# Patient Record
Sex: Male | Born: 1975 | Race: White | Hispanic: No | Marital: Married | Smoking: Never smoker
Health system: Southern US, Community
[De-identification: ages and names within clinical notes are randomized; demographics above are authoritative.]

## PROBLEM LIST (undated history)

## (undated) DIAGNOSIS — N200 Calculus of kidney: Secondary | ICD-10-CM

## (undated) HISTORY — PX: STONE EXTRACTION WITH BASKET: SHX5318

## (undated) HISTORY — PX: LITHOTRIPSY: SUR834

---

## 2013-04-03 ENCOUNTER — Emergency Department (HOSPITAL_COMMUNITY)
Admission: EM | Admit: 2013-04-03 | Discharge: 2013-04-04 | Disposition: A | Discharge status: Home / Self Care | Payer: Medicaid - Out of State | Attending: Emergency Medicine | Admitting: Emergency Medicine

## 2013-04-03 ENCOUNTER — Encounter (HOSPITAL_COMMUNITY): Payer: Self-pay | Admitting: Emergency Medicine

## 2013-04-03 ENCOUNTER — Emergency Department (HOSPITAL_COMMUNITY): Payer: Medicaid - Out of State

## 2013-04-03 DIAGNOSIS — R109 Unspecified abdominal pain: Secondary | ICD-10-CM | POA: Insufficient documentation

## 2013-04-03 DIAGNOSIS — Z87442 Personal history of urinary calculi: Secondary | ICD-10-CM | POA: Insufficient documentation

## 2013-04-03 DIAGNOSIS — R319 Hematuria, unspecified: Secondary | ICD-10-CM | POA: Insufficient documentation

## 2013-04-03 HISTORY — DX: Calculus of kidney: N20.0

## 2013-04-03 LAB — URINALYSIS, ROUTINE W REFLEX MICROSCOPIC
Bilirubin Urine: NEGATIVE
Glucose, UA: NEGATIVE mg/dL
Ketones, ur: NEGATIVE mg/dL
Leukocytes, UA: NEGATIVE
Nitrite: NEGATIVE
Protein, ur: NEGATIVE mg/dL
Specific Gravity, Urine: 1.028 (ref 1.005–1.030)
Urobilinogen, UA: 1 mg/dL (ref 0.0–1.0)

## 2013-04-03 LAB — POCT I-STAT, CHEM 8
BUN: 9 mg/dL (ref 6–23)
Creatinine, Ser: 1.3 mg/dL (ref 0.50–1.35)
Glucose, Bld: 109 mg/dL — ABNORMAL HIGH (ref 70–99)
HCT: 47 % (ref 39.0–52.0)
Hemoglobin: 16 g/dL (ref 13.0–17.0)
Potassium: 3.2 mEq/L — ABNORMAL LOW (ref 3.5–5.1)
TCO2: 22 mmol/L (ref 0–100)

## 2013-04-03 MED ORDER — ONDANSETRON HCL 4 MG/2ML IJ SOLN
4.0000 mg | Freq: Once | INTRAMUSCULAR | Status: AC
  Administered 2013-04-03: 4 mg via INTRAVENOUS
  Filled 2013-04-03: qty 2

## 2013-04-03 MED ORDER — MORPHINE SULFATE 4 MG/ML IJ SOLN
4.0000 mg | Freq: Once | INTRAMUSCULAR | Status: AC
  Administered 2013-04-03: 4 mg via INTRAVENOUS
  Filled 2013-04-03: qty 1

## 2013-04-03 NOTE — ED Notes (Signed)
Notified EDP, Steinl,MD. Pt. i-stat Chem 8 results 3.2. RN, Sharon Seller made aware.

## 2013-04-03 NOTE — ED Notes (Signed)
Patient is alert and oriented x3.  He is complaining of right flank pain that started about 4 hours ago. He states that he does have a history of kidney stones.  Currently he rates he is pain 7 of 10 without nausea.

## 2013-04-03 NOTE — ED Provider Notes (Signed)
CSN: [id number]     Arrival date & time 04/03/13  2145 History   First MD Initiated Contact with Patient 04/03/13 2210     Chief Complaint  Patient presents with  . Flank Pain    right side   (Consider location/radiation/quality/duration/timing/severity/associated sxs/prior Treatment) HPI Comments: Patient presents today with a chief complaint of right flank pain.  He reports that he feels like the pain is moving to the right side of his abdomen.  Pain has been present for the past 4 hours.  He has a history of kidney stones and reports that the pain today feels similar.  He has not taken anything for pain prior to arrival.  He reports associated hematuria.  He denies dysuria, decreased urination, increased urinary frequency, or urgency.  Denies fever, chills, nausea, or vomiting.  He reports that he is from [place] and is followed by a Insurance underwriter in [place].    Patient is a 37 y.o. male presenting with flank pain. The history is provided by the patient.  Flank Pain Pertinent negatives include no chills or fever.    Past Medical History  Diagnosis Date  . Kidney stones    History reviewed. No pertinent past surgical history. History reviewed. No pertinent family history. History  Substance Use Topics  . Smoking status: Never Smoker   . Smokeless tobacco: Not on file  . Alcohol Use: No    Review of Systems  Constitutional: Negative for fever and chills.  Genitourinary: Positive for hematuria and flank pain.  All other systems reviewed and are negative.    Allergies  Review of patient's allergies indicates no known allergies.  Home Medications   Current Outpatient Rx  Name  Route  Sig  Dispense  Refill  . ibuprofen (ADVIL,MOTRIN) 200 MG tablet   Oral   Take 200 mg by mouth every 6 (six) hours as needed for moderate pain.          BP 151/77  Pulse 82  Temp(Src) 98.6 F (37 C) (Oral)  Resp 18  SpO2 93% Physical Exam  Nursing note and vitals  reviewed. Constitutional: He appears well-developed and well-nourished.  HENT:  Head: Normocephalic and atraumatic.  Mouth/Throat: Oropharynx is clear and moist.  Neck: Normal range of motion. Neck supple.  Cardiovascular: Normal rate, regular rhythm and normal heart sounds.   Pulmonary/Chest: Effort normal and breath sounds normal.  Abdominal: Soft. Bowel sounds are normal. He exhibits no distension and no mass. There is no tenderness. There is CVA tenderness. There is no rebound and no guarding.  Right CVA tenderness  Neurological: He is alert.  Skin: Skin is warm and dry.  Psychiatric: He has a normal mood and affect.    ED Course  Procedures (including critical care time) Labs Review Labs Reviewed  URINALYSIS, ROUTINE W REFLEX MICROSCOPIC - Abnormal; Notable for the following:    Hgb urine dipstick LARGE (*)    All other components within normal limits  URINE MICROSCOPIC-ADD ON   Imaging Review US Renal  04/04/2013   CLINICAL DATA:  Right flank pain  EXAM: RENAL/URINARY TRACT ULTRASOUND COMPLETE  COMPARISON:  None.  FINDINGS: Right Kidney  Length: 11.9 cm. Echogenicity within normal limits. No mass or hydronephrosis visualized.  Left Kidney  Length: 11.8 cm. Echogenicity within normal limits. No mass or hydronephrosis visualized.  Bladder  Appears normal for degree of bladder distention.  IMPRESSION: Normal renal ultrasound.   Electronically Signed   By: Sherian Rein M.D.   On: 04/04/2013 00:48  EKG Interpretation   None      1:09 AM Reassessed patient.  He reports that his pain is improved at this time.  MDM  No diagnosis found. Patient with history of kidney stones presents today with a chief complaint of right flank pain that he reports is consistent with previous kidney stones.  UA does not show any evidence of infection.  Patient with normal renal ultrasound.  Patient did not want CT ab/pelvis w/o contrast due to radiation exposure.  Pain controlled while in the  ED.  Patient stable for discharge.  Patient instructed to follow up with Urologist in [place].  Return precautions given.    Santiago Glad, PA-C 04/04/13 [phone]

## 2013-04-04 MED ORDER — HYDROCODONE-ACETAMINOPHEN 5-325 MG PO TABS: 2.0000 | ORAL_TABLET | Freq: Four times a day (QID) | ORAL | Status: DC | PRN

## 2013-04-04 MED ORDER — TAMSULOSIN HCL 0.4 MG PO CAPS: 0.4000 mg | ORAL_CAPSULE | Freq: Every day | ORAL | Status: DC

## 2013-04-04 MED ORDER — ONDANSETRON 4 MG PO TBDP: 4.0000 mg | ORAL_TABLET | Freq: Three times a day (TID) | ORAL | Status: DC | PRN

## 2013-04-04 MED ORDER — MORPHINE SULFATE 4 MG/ML IJ SOLN
4.0000 mg | Freq: Once | INTRAMUSCULAR | Status: AC
Start: 2013-04-04 — End: 2013-04-04
  Administered 2013-04-04: 4 mg via INTRAVENOUS
  Filled 2013-04-04: qty 1

## 2013-04-04 NOTE — ED Provider Notes (Signed)
Medical screening examination/treatment/procedure(s) were conducted as a shared visit with non-physician practitioner(s) and myself.  I personally evaluated the patient during the encounter.  EKG Interpretation   None       Pt c/o right flank pain. abd soft nt. No cva tenderness. Spine nt. Labs, imaging.   Suzi Roots, MD 04/04/13 [phone]

## 2013-04-04 NOTE — ED Notes (Signed)
US at bedside

## 2013-04-17 ENCOUNTER — Ambulatory Visit: Payer: 59

## 2013-04-17 ENCOUNTER — Ambulatory Visit: Payer: 59 | Admitting: Family Medicine

## 2013-04-17 VITALS — BP 118/68 | HR 133 | Temp 98.8°F | Resp 18 | Ht 68.0 in | Wt 185.0 lb

## 2013-04-17 DIAGNOSIS — M25511 Pain in right shoulder: Secondary | ICD-10-CM

## 2013-04-17 DIAGNOSIS — M25519 Pain in unspecified shoulder: Secondary | ICD-10-CM

## 2013-04-17 DIAGNOSIS — S43429A Sprain of unspecified rotator cuff capsule, initial encounter: Secondary | ICD-10-CM

## 2013-04-17 DIAGNOSIS — S43421A Sprain of right rotator cuff capsule, initial encounter: Secondary | ICD-10-CM

## 2013-04-17 MED ORDER — HYDROCODONE-ACETAMINOPHEN 7.5-325 MG PO TABS: 1.0000 | ORAL_TABLET | Freq: Four times a day (QID) | ORAL | Status: DC | PRN

## 2013-04-17 NOTE — Patient Instructions (Signed)
Continue to try and exercise shoulder gently.  Take ibuprofen 800 mg 3 times daily as needed for pain and inflammation  For severe pain take the Norco one every 6 hours as needed  Referral is being made to an orthopedist. If you do not hear from her referrals desk by Wednesday please call back

## 2013-04-17 NOTE — Progress Notes (Signed)
   Subjective:    Patient ID: Michael Smith, male    DOB: [birth year], 37 y.o.   MRN: [id number]  HPI    Review of Systems     Objective:   Physical Exam        Assessment & Plan:

## 2013-04-17 NOTE — Progress Notes (Signed)
Subjective: Patient has a history of a rotator cuff tear. He moved here from elsewhere. He had a previous MRI earlier this year that showed a rotator cuff tear. He had been treated with exercises and rest and medication. He doesn't well. He used narcotic pain patches for a couple of months on and off. He said he had trouble when they stopped his Norco 10, but didn't have any trouble with the pain patches. He move some furniture the other day and hurt himself again. It was hurting intensely and anterior aspect of the right shoulder. He would like further evaluation and help.  Objective: Very limited range of motion right shoulder. Tender anterior aspect of the shoulder  [facility] reading (PRIMARY) by  Dr. Alwyn Ren Normal x-ray shoulder  Assessment: Painful right shoulder Rotator cuff tear right shoulder  Plan: Refer to orthopedics Dr Rennis Chris if possible .

## 2013-04-19 ENCOUNTER — Ambulatory Visit: Payer: 59 | Admitting: Family Medicine

## 2013-04-19 VITALS — BP 120/102 | HR 115 | Temp 98.7°F | Resp 18 | Wt 189.0 lb

## 2013-04-19 DIAGNOSIS — M25511 Pain in right shoulder: Secondary | ICD-10-CM

## 2013-04-19 DIAGNOSIS — M25519 Pain in unspecified shoulder: Secondary | ICD-10-CM

## 2013-04-19 MED ORDER — OXYCODONE-ACETAMINOPHEN 5-325 MG PO TABS: 1.0000 | ORAL_TABLET | Freq: Four times a day (QID) | ORAL | Status: DC | PRN

## 2013-04-19 NOTE — Progress Notes (Addendum)
Subjective:    Patient ID: Michael Smith, male    DOB: Feb 13, 1976, 37 y.o.   MRN: [id number]  This chart was scribed for Michael Staggers, MD by Michael Smith, Medical Scribe. This patient's care was started at 12:26 PM.  HPI HPI Comments: Michael Smith is a 37 y.o. male who presents to the office for follow up. Pt saw Dr. Alwyn Smith 2 days ago for right shoulder pain that intermittently radiates into his right arm that started 4 days prior to his last visit. Movement worsens the pain. Pt was given Vicodin and an orthopedic referral by Dr. Alwyn Smith. Right shoulder xray 2 days ago showed no evidence of fracture or dislocation. Normal exam. He has also taken 800 mg ibuprofen every 4 hours with no relief. Pt has not been given any other pain medications since moving here one month ago. He was diagnosed with a rotator cuff tear 4 months ago while living in [place] and was awaiting surgery for treatment. There was a hold up on surgery due to insurance problems. He was given fentanyl patches with some relief of pain. Denies history of addiction to pain medications.   There are no active problems to display for this patient.  Past Medical History  Diagnosis Date  . Kidney stones    Past Surgical History  Procedure Laterality Date  . Lithotripsy     No Known Allergies Prior to Admission medications   Medication Sig Start Date End Date Taking? Authorizing Provider  HYDROcodone-acetaminophen (NORCO) 7.5-325 MG per tablet Take 1 tablet by mouth every 6 (six) hours as needed. 04/17/13  Yes Michael Najjar, MD  ibuprofen (ADVIL,MOTRIN) 200 MG tablet Take 200 mg by mouth every 6 (six) hours as needed for moderate pain.   Yes Historical Provider, MD  tamsulosin (FLOMAX) 0.4 MG CAPS capsule Take 1 capsule (0.4 mg total) by mouth daily. 04/04/13  Yes Michael Laisure, PA-C  HYDROcodone-acetaminophen (NORCO/VICODIN) 5-325 MG per tablet Take 2 tablets by mouth every 6 (six) hours as needed. 04/04/13   Michael  Laisure, PA-C  ondansetron (ZOFRAN ODT) 4 MG disintegrating tablet Take 1 tablet (4 mg total) by mouth every 8 (eight) hours as needed for nausea or vomiting. 04/04/13   Michael Glad, PA-C   History   Social History  . Marital Status: Married    Spouse Name: N/A    Number of Children: N/A  . Years of Education: N/A   Occupational History  . Not on file.   Social History Main Topics  . Smoking status: Never Smoker   . Smokeless tobacco: Not on file  . Alcohol Use: No  . Drug Use: Not on file  . Sexual Activity: Not on file   Other Topics Concern  . Not on file   Social History Narrative  . No narrative on file    Review of Systems  Musculoskeletal: Positive for arthralgias and myalgias.       Objective:   Physical Exam  Vitals reviewed. Constitutional: He is oriented to person, place, and time. He appears well-developed and well-nourished. No distress.  HENT:  Head: Normocephalic and atraumatic.  Neck: Neck supple.  Slight upper right shoulder pain with left lateral flexion of the neck. Negative Spurling's. Full ROM.   Cardiovascular: Normal rate.   Capillary refill less than 2 seconds. 2+ radial pulse.   Pulmonary/Chest: Effort normal.  Musculoskeletal:  Right shoulder no focal tenderness. Bicipital groove non tender. AC non tender. Guarded with active ROM. Flexion is only approximately 30  degrees. Same with abduction. Internal rotation to greater troch of hip. Still guarded with passive ROM. Flexion and abduction only to 30-45 degrees due to pain.   Neurological: He is alert and oriented to person, place, and time.  Neurovascularly intact distally.   Skin: Skin is warm and dry.  Psychiatric: He has a normal mood and affect. His behavior is normal.    Filed Vitals:   04/19/13 1209  BP: 120/102  Pulse: 115  Temp: 98.7 F (37.1 C)  TempSrc: Oral  Resp: 18  Weight: 189 lb (85.73 kg)  SpO2: 100%   Controlled substance database reviewed. Correlates with  history provided with one prior prescription before rx here 2 days ago.   Assessment & Plan:   Michael Smith is a 37 y.o. male Pain in joint, shoulder region, right - Plan: oxyCODONE-acetaminophen (ROXICET) 5-325 MG per tablet  R Rotator cuff tear by history , but MRI unavailable for review. Guarded exam - difficult to assess today, with increased pain, but DDX of adhesive capsulitis with degree of guarding and difficult PROM on eval. CSRS reviewed and matches history provided. Will Rx short course of percocet as below until seen by ortho, and sling - has at home. He will obtain copy of MRI from prior provider.   Meds ordered this encounter  Medications  . oxyCODONE-acetaminophen (ROXICET) 5-325 MG per tablet    Sig: Take 1-2 tablets by mouth every 6 (six) hours as needed for severe pain.    Dispense:  20 tablet    Refill:  0    Patient Instructions  I will provide a temporary prescription for percocet until you can be seen by orthopaedic doctor.  Based on your exam you may have a possible "frozen shoulder" or adhesive capsulitis, but this can be discussed with orthopaedic doctor. Try to get a copy of your MRI and report as this may be needed at your visit with Ortho.  Return to the clinic or go to the nearest emergency room if any of your symptoms worsen or new symptoms occur.     I personally performed the services described in this documentation, which was scribed in my presence. The recorded information has been reviewed and considered, and addended by me as needed.

## 2013-04-19 NOTE — Patient Instructions (Addendum)
I will provide a temporary prescription for percocet until you can be seen by orthopaedic doctor.  Based on your exam you may have a possible "frozen shoulder" or adhesive capsulitis, but this can be discussed with orthopaedic doctor. Try to get a copy of your MRI and report as this may be needed at your visit with Ortho.  Return to the clinic or go to the nearest emergency room if any of your symptoms worsen or new symptoms occur.

## 2013-04-21 ENCOUNTER — Ambulatory Visit (INDEPENDENT_AMBULATORY_CARE_PROVIDER_SITE_OTHER): Payer: 59 | Admitting: Family Medicine

## 2013-04-21 VITALS — BP 122/102 | HR 109 | Temp 98.7°F | Resp 18 | Wt 188.0 lb

## 2013-04-21 DIAGNOSIS — G894 Chronic pain syndrome: Secondary | ICD-10-CM

## 2013-04-21 DIAGNOSIS — M25511 Pain in right shoulder: Secondary | ICD-10-CM

## 2013-04-21 DIAGNOSIS — M25519 Pain in unspecified shoulder: Secondary | ICD-10-CM

## 2013-04-21 MED ORDER — OXYCODONE-ACETAMINOPHEN 5-325 MG PO TABS: 1.0000 | ORAL_TABLET | Freq: Four times a day (QID) | ORAL | Status: DC | PRN

## 2013-04-21 NOTE — Patient Instructions (Signed)
I will write for 1 week of percocet to be taken up to 1-2 every 6 hours as needed. Will not be able to fill early. Go to the nearest emergency room if any of your symptoms worsen or new symptoms occur. We will continue to work on orthopaedic referral, and refer to pain management as will not be able to provide chronic pain medications at our office. If still not seen by ortho in 1 week - return to discuss plan and medications.

## 2013-04-21 NOTE — Progress Notes (Addendum)
Subjective:    Patient ID: Michael Smith, male    DOB: 01/10/1976, 37 y.o.   MRN: [id number] This chart was scribed for Meredith Staggers, MD by Danella Maiers, ED Scribe. This patient was seen in room 3 and the patient's care was started at 4:41 PM.  Chief Complaint  Patient presents with  . Follow-up    riight shoulder    HPI HPI Comments: Michael Smith is a 37 y.o. male who presents to the [facility] for a f/u from 2 days ago with right shoulder pain. Had MRI with rotator cuff tear 4 months ago and had been referred to orthopaedics for eval. Increased pain last office visit uncontrolled with Vicodin prescription for percocet 1-2 every 6 hours.   Pt reports he is taking 2 percocet every 6 hours during the day. He states he has been taking 2 every four hours the last 2 nights to be able to sleep. He states it is helping the pain. He was previously seeing PCP Dr Michael Smith at [facility] in [place]. His wife is sending a copy of the MRI. Pt was seen by a different orthopedist 4 months ago and had 2nd MRI. He states he has not had surgery yet because of insurance issues. He states he had withdrawal symptoms a couple years ago with some concern for addiction but denies feeling addicted now.  Call placed to Dr Michael Smith at [facility] phone number [phone]. Per discussion pt had MRI approximately 1 1/2 years ago with partial thickness tear. Was referred to ortho but was not able to keep that appt. Per Dr Michael Smith, did have some concerns with early refill requests for pain medications and had taken fentanyl patch. Checked into referral locally, unable to be seen by initial office due to self pay status, but will be referred to [facility]. Fax from Dr Michael Smith regarding MRI noted. MRI of right shoulder Sep 24 2011 had mild partial thickness tearing or tendinopathy within the distal infraspinatus tendon along with early degenerative joint disease.   Not wearing sling in office  as was too hot, but has been wearing this.    There are no active problems to display for this patient.  Past Medical History  Diagnosis Date  . Kidney stones    Past Surgical History  Procedure Laterality Date  . Lithotripsy     No Known Allergies Prior to Admission medications   Medication Sig Start Date End Date Taking? Authorizing Provider  ibuprofen (ADVIL,MOTRIN) 200 MG tablet Take 200 mg by mouth every 6 (six) hours as needed for moderate pain.   Yes Historical Provider, MD  oxyCODONE-acetaminophen (ROXICET) 5-325 MG per tablet Take 1-2 tablets by mouth every 6 (six) hours as needed for severe pain. 04/19/13  Yes Shade Flood, MD  tamsulosin (FLOMAX) 0.4 MG CAPS capsule Take 1 capsule (0.4 mg total) by mouth daily. 04/04/13  Yes Heather Laisure, PA-C  HYDROcodone-acetaminophen (NORCO) 7.5-325 MG per tablet Take 1 tablet by mouth every 6 (six) hours as needed. 04/17/13   Peyton Najjar, MD  HYDROcodone-acetaminophen (NORCO/VICODIN) 5-325 MG per tablet Take 2 tablets by mouth every 6 (six) hours as needed. 04/04/13   Santiago Glad, PA-C   History  Substance Use Topics  . Smoking status: Never Smoker   . Smokeless tobacco: Not on file  . Alcohol Use: No    Review of Systems  Musculoskeletal: Positive for arthralgias (right shoulder).  Objective:   Physical Exam  Nursing note and vitals reviewed. Constitutional: He is oriented to person, place, and time. He appears well-developed and well-nourished. No distress.  HENT:  Head: Normocephalic and atraumatic.  Eyes: EOM are normal.  Neck: Neck supple. No tracheal deviation present.  Cardiovascular: Normal rate and regular rhythm.  Exam reveals no gallop and no friction rub.   No murmur heard. Pulmonary/Chest: Effort normal. No respiratory distress.  Musculoskeletal: Normal range of motion.  Minimal soreness anterior shoulder at biceps groove. No SV or AC tenderness. Only able to move approximately 30degress  flexion and abduction. Internal rotation to greater trochanter of right hip.  Neurological: He is alert and oriented to person, place, and time.  Neurovascularly intact distally to fingertips.  Skin: Skin is warm and dry.  Psychiatric: He has a normal mood and affect. His behavior is normal.     Filed Vitals:   04/21/13 1636  BP: 122/102  Pulse: 109  Temp: 98.7 F (37.1 C)  TempSrc: Oral  Resp: 18  Weight: 188 lb (85.276 kg)  SpO2: 99%        Assessment & Plan:   Michael Smith is a 37 y.o. male Pain in joint, shoulder region, right - Plan: oxyCODONE-acetaminophen (ROXICET) 5-325 MG per tablet, Ambulatory referral to Pain Clinic  Chronic pain syndrome - Plan: Ambulatory referral to Pain Clinic  R shoulder pain, chronic by hx, and partial thickness RTC tear on MRI form 2013, discussed with prior provider in [place]. More recent MRI from few months ago is pending. reinjury recently with moving furniture, but XR without fx. Ortho eval pending, and discussed repeat MRI, but plan to follow up with orhto planned first to determine this. Discussion with prior provider -noted some concern of early refills of medications and discussed this with pt. Addiction to these medications was denied but I discussed if he felt this was a concern I would be happy to provide resources. Will continue percocet 1-2 every 6 hours with one week of medication provided while waiting on ortho eval. Will also refer to pain management in case longer course of medication will be needed but can follow up with me in one week if has not been seen by ortho. To wear sling and avoid use of right arm for now. ER precautions discussed.   Meds ordered this encounter  Medications  . oxyCODONE-acetaminophen (ROXICET) 5-325 MG per tablet    Sig: Take 1-2 tablets by mouth every 6 (six) hours as needed for severe pain.    Dispense:  56 tablet    Refill:  0   Patient Instructions  I will write for 1 week of percocet to be  taken up to 1-2 every 6 hours as needed. Will not be able to fill early. Go to the nearest emergency room if any of your symptoms worsen or new symptoms occur. We will continue to work on orthopaedic referral, and refer to pain management as will not be able to provide chronic pain medications at our office. If still not seen by ortho in 1 week - return to discuss plan and medications.

## 2013-04-24 ENCOUNTER — Emergency Department (HOSPITAL_COMMUNITY): Payer: 59

## 2013-04-24 ENCOUNTER — Emergency Department (HOSPITAL_COMMUNITY)
Admission: EM | Admit: 2013-04-24 | Discharge: 2013-04-24 | Disposition: A | Discharge status: Home / Self Care | Payer: 59 | Attending: Emergency Medicine | Admitting: Emergency Medicine

## 2013-04-24 ENCOUNTER — Encounter (HOSPITAL_COMMUNITY): Payer: Self-pay | Admitting: Emergency Medicine

## 2013-04-24 DIAGNOSIS — Z79899 Other long term (current) drug therapy: Secondary | ICD-10-CM | POA: Insufficient documentation

## 2013-04-24 DIAGNOSIS — R109 Unspecified abdominal pain: Secondary | ICD-10-CM

## 2013-04-24 DIAGNOSIS — N200 Calculus of kidney: Secondary | ICD-10-CM | POA: Insufficient documentation

## 2013-04-24 LAB — URINE MICROSCOPIC-ADD ON

## 2013-04-24 LAB — URINALYSIS, ROUTINE W REFLEX MICROSCOPIC
Bilirubin Urine: NEGATIVE
Glucose, UA: NEGATIVE mg/dL
Ketones, ur: NEGATIVE mg/dL
Protein, ur: NEGATIVE mg/dL
Specific Gravity, Urine: 1.023 (ref 1.005–1.030)
Urobilinogen, UA: 0.2 mg/dL (ref 0.0–1.0)

## 2013-04-24 MED ORDER — ONDANSETRON 4 MG PO TBDP: 4.0000 mg | ORAL_TABLET | Freq: Three times a day (TID) | ORAL | Status: DC | PRN

## 2013-04-24 MED ORDER — KETOROLAC TROMETHAMINE 30 MG/ML IJ SOLN
30.0000 mg | Freq: Once | INTRAMUSCULAR | Status: DC
  Filled 2013-04-24: qty 1

## 2013-04-24 MED ORDER — OXYCODONE-ACETAMINOPHEN 10-650 MG PO TABS: 1.0000 | ORAL_TABLET | Freq: Four times a day (QID) | ORAL | Status: DC | PRN

## 2013-04-24 MED ORDER — MORPHINE SULFATE 4 MG/ML IJ SOLN
4.0000 mg | Freq: Once | INTRAMUSCULAR | Status: AC
  Administered 2013-04-24: 4 mg via INTRAVENOUS
  Filled 2013-04-24: qty 1

## 2013-04-24 MED ORDER — ONDANSETRON HCL 4 MG/2ML IJ SOLN
4.0000 mg | Freq: Once | INTRAMUSCULAR | Status: AC
  Administered 2013-04-24: 4 mg via INTRAVENOUS
  Filled 2013-04-24: qty 2

## 2013-04-24 MED ORDER — SODIUM CHLORIDE 0.9 % IV SOLN
Freq: Once | INTRAVENOUS | Status: AC
  Administered 2013-04-24: 03:00:00 via INTRAVENOUS

## 2013-04-24 MED ORDER — KETOROLAC TROMETHAMINE 30 MG/ML IJ SOLN
30.0000 mg | Freq: Once | INTRAMUSCULAR | Status: AC
  Administered 2013-04-24: 30 mg via INTRAVENOUS

## 2013-04-24 NOTE — ED Notes (Signed)
Pt presents with c/o right flank pain that started about 2 hrs ago  Pt has hx of kidney stones  Pt denies nausea or vomiting at this time  Pt states he does have blood in his urine

## 2013-04-24 NOTE — ED Provider Notes (Signed)
CSN: [id number]     Arrival date & time 04/24/13  0226 History   First MD Initiated Contact with Patient 04/24/13 [phone]     Chief Complaint  Patient presents with  . Flank Pain   (Consider location/radiation/quality/duration/timing/severity/associated sxs/prior Treatment) HPI Comments: Patient states he has a history of kidney stands.  He, states is usually able to pass the stones, but did have to have lithotripsy, one time for 8mm stone, usually seen in his home of  [place].  He is currently in [place], working a job, and plans to relocate here.  He was seen November 14 for right flank pain with a negative ultrasound, but states he passed a small stone the next day.  He did fine until 2 hours prior to arrival when he again developed right flank pain, and hematuria.  He too 2 Percocet  for his discomfort without relief. He, states he has nausea, but no vomiting.  No diarrhea  Patient is a 37 y.o. male presenting with flank pain. The history is provided by the patient.  Flank Pain This is a recurrent problem. The current episode started today. The problem occurs constantly. The problem has been unchanged. Associated symptoms include nausea. Pertinent negatives include no fever, urinary symptoms or vomiting. Nothing aggravates the symptoms. He has tried nothing for the symptoms. The treatment provided no relief.    Past Medical History  Diagnosis Date  . Kidney stones    Past Surgical History  Procedure Laterality Date  . Lithotripsy     History reviewed. No pertinent family history. History  Substance Use Topics  . Smoking status: Never Smoker   . Smokeless tobacco: Not on file  . Alcohol Use: No    Review of Systems  Constitutional: Negative for fever.  Gastrointestinal: Positive for nausea. Negative for vomiting.  Genitourinary: Positive for flank pain. Negative for dysuria, frequency and scrotal swelling.  All other systems reviewed and are negative.    Allergies   Review of patient's allergies indicates no known allergies.  Home Medications   Current Outpatient Rx  Name  Route  Sig  Dispense  Refill  . oxyCODONE-acetaminophen (ROXICET) 5-325 MG per tablet   Oral   Take 1-2 tablets by mouth every 6 (six) hours as needed for severe pain.   56 tablet   0   . tamsulosin (FLOMAX) 0.4 MG CAPS capsule   Oral   Take 1 capsule (0.4 mg total) by mouth daily.   30 capsule   0   . ibuprofen (ADVIL,MOTRIN) 200 MG tablet   Oral   Take 200 mg by mouth every 6 (six) hours as needed for moderate pain.         Marland Kitchen ondansetron (ZOFRAN ODT) 4 MG disintegrating tablet   Oral   Take 1 tablet (4 mg total) by mouth every 8 (eight) hours as needed for nausea or vomiting.   20 tablet   0   . oxyCODONE-acetaminophen (PERCOCET) 10-650 MG per tablet   Oral   Take 1 tablet by mouth every 6 (six) hours as needed for pain.   20 tablet   0    BP 181/147  Pulse 96  Temp(Src) 98.5 F (36.9 C) (Oral)  Resp 20  SpO2 99% Physical Exam  Nursing note and vitals reviewed. Constitutional: He is oriented to person, place, and time. He appears well-developed and well-nourished. He appears distressed.  HENT:  Head: Normocephalic.  Eyes: Pupils are equal, round, and reactive to light.  Neck: Normal range  of motion.  Cardiovascular: Regular rhythm.   Pulmonary/Chest: Effort normal.  Musculoskeletal: Normal range of motion.  Neurological: He is alert and oriented to person, place, and time.  Skin: Skin is warm. There is pallor.    ED Course  Procedures (including critical care time) Labs Review Labs Reviewed  URINALYSIS, ROUTINE W REFLEX MICROSCOPIC - Abnormal; Notable for the following:    APPearance CLOUDY (*)    Hgb urine dipstick LARGE (*)    All other components within normal limits  URINE MICROSCOPIC-ADD ON   Imaging Review Ct Abdomen Pelvis Wo Contrast  04/24/2013   CLINICAL DATA:  Right flank pain, gross hematuria.  EXAM: CT ABDOMEN AND PELVIS  WITHOUT CONTRAST  TECHNIQUE: Multidetector CT imaging of the abdomen and pelvis was performed following the standard protocol without intravenous contrast.  COMPARISON:  None.  FINDINGS: 3 mm nodule is noted within the left lower lobe (series 3, image 4). Lungs are otherwise unremarkable.  Limited noncontrast evaluation liver is unremarkable. Gallbladder is within normal limits. There is no biliary ductal dilatation.  A few scattered subcentimeter splenic hypodensities are noted, the largest of which measures 5 mm (series 2, image 19). These are too small the characterize by CT. Spleen is otherwise unremarkable. The adrenal glands and pancreas are within normal limits.  Multiple nonobstructive calculi are seen within the left kidney, the largest of which is located in the lower pole and measures 4 mm. No obstructive left renal stones identified. There is no left-sided hydronephrosis or hydroureter.  On the right, a 4 mm obstructive stone is present within the mid right ureter (series 4, image 41). There is secondary moderate hydroureteronephrosis proximally. Additional tiny nonobstructive renal calculi are seen within the right kidney, the largest of which is located in the lower pole calyx and measures 4 mm. Sign  There is no evidence of bowel obstruction. Appendix is well visualized in the right lower quadrant and is unremarkable. No inflammatory changes seen about the bowels.  The bladder is largely decompressed and not well evaluated. Prostate is normal.  No free air or fluid. No pathologically enlarged intra-abdominal pelvic lymph nodes.  Osseous structures are within normal limits.  IMPRESSION: 1. 4 mm obstructive stone within the mid right ureter with secondary moderate hydroureteronephrosis proximally. 2. Additional bilateral nonobstructive renal calculi as above. 3. 3 mm left lower lobe pulmonary nodule. If the patient has no underlying risk factors (i.e. smoking), no further followup is needed. If there  are underlying risk factors, a followup CT in 12 months could be considered to evaluate for interval change as clinically desired.   Electronically Signed   By: Rise Mu M.D.   On: 04/24/2013 03:31    EKG Interpretation   None       MDM   1. Right flank pain   2. Kidney stone     Mr. per states, that his pain is waxing and waning between a 4 and 5.  He thinks he can manage at home with pain management antiemetics.  He will be given a referral to local urology he knows he can return at any time if needed.  For increased intolerable, unmanageable pain    Arman Filter, NP 04/24/13 [phone]

## 2013-04-24 NOTE — ED Notes (Signed)
Pt aware of need for urine sample.  

## 2013-04-24 NOTE — ED Notes (Signed)
Patient transported to CT 

## 2013-04-24 NOTE — ED Provider Notes (Signed)
Medical screening examination/treatment/procedure(s) were performed by non-physician practitioner and as supervising physician I was immediately available for consultation/collaboration.    Avalee Castrellon, MD 04/24/13 0609 

## 2013-04-26 ENCOUNTER — Emergency Department (HOSPITAL_COMMUNITY)
Admission: EM | Admit: 2013-04-26 | Discharge: 2013-04-26 | Disposition: A | Discharge status: Home / Self Care | Payer: 59 | Attending: Emergency Medicine | Admitting: Emergency Medicine

## 2013-04-26 ENCOUNTER — Emergency Department (HOSPITAL_COMMUNITY): Payer: 59

## 2013-04-26 ENCOUNTER — Encounter (HOSPITAL_COMMUNITY): Payer: Self-pay | Admitting: Emergency Medicine

## 2013-04-26 DIAGNOSIS — Z87442 Personal history of urinary calculi: Secondary | ICD-10-CM | POA: Insufficient documentation

## 2013-04-26 DIAGNOSIS — Z79899 Other long term (current) drug therapy: Secondary | ICD-10-CM | POA: Insufficient documentation

## 2013-04-26 DIAGNOSIS — N201 Calculus of ureter: Secondary | ICD-10-CM

## 2013-04-26 LAB — BASIC METABOLIC PANEL
CO2: 25 mEq/L (ref 19–32)
Calcium: 9.6 mg/dL (ref 8.4–10.5)
Creatinine, Ser: 0.99 mg/dL (ref 0.50–1.35)
GFR calc Af Amer: >90 mL/min (ref >90)
Potassium: 3.5 mEq/L (ref 3.5–5.1)
Sodium: 135 mEq/L (ref 135–145)

## 2013-04-26 LAB — URINE MICROSCOPIC-ADD ON

## 2013-04-26 LAB — URINALYSIS, ROUTINE W REFLEX MICROSCOPIC
Bilirubin Urine: NEGATIVE
Glucose, UA: NEGATIVE mg/dL
Ketones, ur: NEGATIVE mg/dL
Leukocytes, UA: NEGATIVE
Nitrite: NEGATIVE
Protein, ur: NEGATIVE mg/dL
Urobilinogen, UA: 0.2 mg/dL (ref 0.0–1.0)
pH: 7.5 (ref 5.0–8.0)

## 2013-04-26 MED ORDER — OXYCODONE-ACETAMINOPHEN 5-325 MG PO TABS
2.0000 | ORAL_TABLET | Freq: Once | ORAL | Status: AC
  Administered 2013-04-26: 2 via ORAL
  Filled 2013-04-26: qty 2

## 2013-04-26 MED ORDER — SODIUM CHLORIDE 0.9 % IV BOLUS (SEPSIS)
1000.0000 mL | Freq: Once | INTRAVENOUS | Status: AC
  Administered 2013-04-26: 1000 mL via INTRAVENOUS

## 2013-04-26 MED ORDER — HYDROMORPHONE HCL PF 1 MG/ML IJ SOLN
1.0000 mg | Freq: Once | INTRAMUSCULAR | Status: AC
  Administered 2013-04-26: 1 mg via INTRAVENOUS
  Filled 2013-04-26: qty 1

## 2013-04-26 MED ORDER — HYDROMORPHONE HCL 4 MG PO TABS: 4.0000 mg | ORAL_TABLET | ORAL | Status: DC | PRN

## 2013-04-26 MED ORDER — KETOROLAC TROMETHAMINE 30 MG/ML IJ SOLN
30.0000 mg | Freq: Once | INTRAMUSCULAR | Status: AC
  Administered 2013-04-26: 30 mg via INTRAVENOUS
  Filled 2013-04-26: qty 1

## 2013-04-26 NOTE — ED Notes (Signed)
Pt aware of the need for urine, cannot provide at this time. Pt given urinal

## 2013-04-26 NOTE — ED Provider Notes (Signed)
CSN: [id number]     Arrival date & time 04/26/13  1629 History   First MD Initiated Contact with Patient 04/26/13 1647     Chief Complaint  Patient presents with  . Nephrolithiasis   (Consider location/radiation/quality/duration/timing/severity/associated sxs/prior Treatment) Patient is a 37 y.o. male presenting with abdominal pain.  Abdominal Pain Pain location:  RLQ and R flank Pain quality: sharp   Pain radiation: r groin. Pain severity:  Severe Onset quality:  Gradual Duration: improved since Friday, until this morning, when it suddenly became worse again. Timing:  Constant Progression:  Worsening Chronicity:  Recurrent Context comment:  Hx of kidney stones, seen in ED 2 days ago, found to have 4mm obstructing stone Relieved by:  Nothing Worsened by:  Nothing tried Ineffective treatments: percocet. Associated symptoms: hematuria   Associated symptoms: no fever, no nausea and no vomiting     Past Medical History  Diagnosis Date  . Kidney stones    Past Surgical History  Procedure Laterality Date  . Lithotripsy     No family history on file. History  Substance Use Topics  . Smoking status: Never Smoker   . Smokeless tobacco: Not on file  . Alcohol Use: No    Review of Systems  Constitutional: Negative for fever.  Gastrointestinal: Positive for abdominal pain. Negative for nausea and vomiting.  Genitourinary: Positive for hematuria.  All other systems reviewed and are negative.    Allergies  Review of patient's allergies indicates no known allergies.  Home Medications   Current Outpatient Rx  Name  Route  Sig  Dispense  Refill  . ibuprofen (ADVIL,MOTRIN) 200 MG tablet   Oral   Take 200 mg by mouth every 8 (eight) hours as needed for moderate pain.          Marland Kitchen oxyCODONE-acetaminophen (PERCOCET) 10-650 MG per tablet   Oral   Take 1 tablet by mouth every 6 (six) hours as needed for pain.   20 tablet   0   . tamsulosin (FLOMAX) 0.4 MG CAPS capsule  Oral   Take 1 capsule (0.4 mg total) by mouth daily.   30 capsule   0    BP 159/133  Pulse 98  Temp(Src) 98 F (36.7 C) (Oral)  Resp 20  SpO2 100% Physical Exam  Nursing note and vitals reviewed. Constitutional: He is oriented to person, place, and time. He appears well-developed and well-nourished. No distress.  HENT:  Head: Normocephalic and atraumatic.  Mouth/Throat: Oropharynx is clear and moist.  Eyes: Conjunctivae are normal. Pupils are equal, round, and reactive to light. No scleral icterus.  Neck: Neck supple.  Cardiovascular: Normal rate, regular rhythm, normal heart sounds and intact distal pulses.   No murmur heard. Pulmonary/Chest: Effort normal and breath sounds normal. No stridor. No respiratory distress. He has no wheezes. He has no rales.  Abdominal: Soft. He exhibits no distension. There is no tenderness. There is no rebound and no guarding.  Musculoskeletal: Normal range of motion. He exhibits no edema.  Neurological: He is alert and oriented to person, place, and time.  Skin: Skin is warm and dry. No rash noted.  Psychiatric: He has a normal mood and affect. His behavior is normal.    ED Course  Procedures (including critical care time) Labs Review Labs Reviewed  URINALYSIS, ROUTINE W REFLEX MICROSCOPIC - Abnormal; Notable for the following:    Hgb urine dipstick SMALL (*)    All other components within normal limits  BASIC METABOLIC PANEL  URINE MICROSCOPIC-ADD  ON   Imaging Review US Renal  04/26/2013   CLINICAL DATA:  Nephrolithiasis.  EXAM: RENAL/URINARY TRACT ULTRASOUND COMPLETE  COMPARISON:  CT 04/24/2013.  FINDINGS: Right Kidney:  Length: 11.1 cm. Prominent renal pelvis is present. No dilation of the calices. No stones are identified. Cortical echotexture appears normal. The prominent renal pelvis likely represents mild hydronephrosis in this patient with history of kidney stones and hematuria.  Left Kidney:  Length: 11.8 cm. Echogenicity within  normal limits. No mass or hydronephrosis visualized.  Bladder:  Bilateral ureteral jets visualized. Normal appearance of the urinary bladder.  IMPRESSION: 1. Mild prominence of the right renal pelvis is most suggesting mild hydronephrosis in a patient with right flank pain and hematuria. Stone not visualized. Consider CT urogram. 2. Normal appearance of the left kidney 3. Bilateral ureteral jets visualized.   Electronically Signed   By: Andreas Newport M.D.   On: 04/26/2013 19:12    EKG Interpretation   None       MDM   1. Ureteral stone    37 yo male with hx of kidney stones presenting with right flank and right abdominal pain.  His pain improved since visiting ED two days ago, but worsened today.  Had a 4mm obstructing stone on right seen on CT 2 days ago.  Will check BMP, UA, and renal ultrasound to assess for complications, but suspect persistent pain from this 4mm stone. No abdominal tenderness or fevers to suggest other etiology such as appendicitis.   Pain controlled.  US shows no worsening features.  Plan continued outpatient management.  Will try PO dilaudid, as this has helped in the ED.    Candyce Churn, MD 04/26/13 2009

## 2013-04-26 NOTE — ED Notes (Signed)
Pt states he was seen here on Friday for kidney stone and that pain meds aren't helping the pain in RLQ.

## 2013-04-28 ENCOUNTER — Encounter: Payer: Self-pay | Admitting: Physical Medicine & Rehabilitation

## 2013-04-29 ENCOUNTER — Ambulatory Visit: Payer: 59 | Admitting: Family Medicine

## 2013-04-29 VITALS — BP 134/98 | HR 136 | Temp 98.1°F | Resp 16 | Ht 68.0 in | Wt 185.0 lb

## 2013-04-29 DIAGNOSIS — M25512 Pain in left shoulder: Secondary | ICD-10-CM

## 2013-04-29 DIAGNOSIS — M25519 Pain in unspecified shoulder: Secondary | ICD-10-CM

## 2013-04-29 DIAGNOSIS — R300 Dysuria: Secondary | ICD-10-CM

## 2013-04-29 DIAGNOSIS — N200 Calculus of kidney: Secondary | ICD-10-CM

## 2013-04-29 LAB — POCT URINALYSIS DIPSTICK
Bilirubin, UA: NEGATIVE
Glucose, UA: NEGATIVE
Ketones, UA: NEGATIVE
Leukocytes, UA: NEGATIVE
Nitrite, UA: NEGATIVE
Urobilinogen, UA: 0.2
pH, UA: 6.5

## 2013-04-29 LAB — POCT UA - MICROSCOPIC ONLY
Bacteria, U Microscopic: NEGATIVE
Casts, Ur, LPF, POC: NEGATIVE
Crystals, Ur, HPF, POC: NEGATIVE
Mucus, UA: NEGATIVE

## 2013-04-29 MED ORDER — KETOROLAC TROMETHAMINE 60 MG/2ML IM SOLN
60.0000 mg | Freq: Once | INTRAMUSCULAR | Status: AC
  Administered 2013-04-29: 60 mg via INTRAMUSCULAR

## 2013-04-29 MED ORDER — OXYCODONE-ACETAMINOPHEN 7.5-325 MG PO TABS: 1.0000 | ORAL_TABLET | ORAL | Status: DC | PRN

## 2013-04-29 NOTE — Progress Notes (Signed)
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  He's had several years of kidney stones and has been treated by ED with pain meds. This episode began 5 days ago.  He is relocating to the area.  HE lives alone.  Wife and five kids are still in [place].  He  Is due to go to pain clinic January 24th  No nausea.  Toradol hasn't worked in the past. He has some percocet at home.  Objective:  Patient moaning and groaning in chair  EXAM:  RENAL/URINARY TRACT ULTRASOUND COMPLETE  COMPARISON: CT 04/24/2013.  FINDINGS:  Right Kidney:  Length: 11.1 cm. Prominent renal pelvis is present. No dilation of  the calices. No stones are identified. Cortical echotexture appears  normal. The prominent renal pelvis likely represents mild  hydronephrosis in this patient with history of kidney stones and  hematuria.  Left Kidney:  Length: 11.8 cm. Echogenicity within normal limits. No mass or  hydronephrosis visualized.  Bladder:  Bilateral ureteral jets visualized. Normal appearance of the urinary  bladder.  IMPRESSION:  1. Mild prominence of the right renal pelvis is most suggesting mild  hydronephrosis in a patient with right flank pain and hematuria.  Stone not visualized. Consider CT urogram.  2. Normal appearance of the left kidney  3. Bilateral ureteral jets visualized.  Electronically Signed  By: Andreas Newport M.D.   Results for orders placed in visit on 04/29/13  POCT URINALYSIS DIPSTICK      Result Value Range   Color, UA yellow     Clarity, UA hazy     Glucose, UA neg     Bilirubin, UA neg     Ketones, UA neg     Spec Grav, UA 1.020     Blood, UA large     pH, UA 6.5     Protein, UA trace     Urobilinogen, UA 0.2     Nitrite, UA neg     Leukocytes, UA Negative    POCT UA - MICROSCOPIC ONLY      Result Value Range   WBC, Ur, HPF, POC 0-1     RBC, urine, microscopic tntc     Bacteria, U Microscopic neg      Mucus, UA neg     Epithelial cells, urine per micros 0-1     Crystals, Ur, HPF, POC neg     Casts, Ur, LPF, POC neg     Yeast, UA neg      On: 04/26/2013 19:12  CLINICAL DATA: Right flank pain, gross hematuria.  EXAM:  CT ABDOMEN AND PELVIS WITHOUT CONTRAST  TECHNIQUE:  Multidetector CT imaging of the abdomen and pelvis was performed  following the standard protocol without intravenous contrast.  COMPARISON: None.  FINDINGS:  3 mm nodule is noted within the left lower lobe (series 3, image 4).  Lungs are otherwise unremarkable.  Limited noncontrast evaluation liver is unremarkable. Gallbladder is  within normal limits. There is no biliary ductal dilatation.  A few scattered subcentimeter splenic hypodensities are noted, the  largest of which measures 5 mm (series 2, image 19). These are too  small the characterize by CT. Spleen is otherwise unremarkable. The  adrenal glands and pancreas are within normal limits.  Multiple nonobstructive calculi are seen within the left kidney, the  largest of which is located in the lower pole and measures 4 mm. No  obstructive left renal stones identified. There  is no left-sided  hydronephrosis or hydroureter.  On the right, a 4 mm obstructive stone is present within the mid  right ureter (series 4, image 41). There is secondary moderate  hydroureteronephrosis proximally. Additional tiny nonobstructive  renal calculi are seen within the right kidney, the largest of which  is located in the lower pole calyx and measures 4 mm. Sign  There is no evidence of bowel obstruction. Appendix is well  visualized in the right lower quadrant and is unremarkable. No  inflammatory changes seen about the bowels.  The bladder is largely decompressed and not well evaluated. Prostate  is normal.  No free air or fluid. No pathologically enlarged intra-abdominal  pelvic lymph nodes.  Osseous structures are within normal limits.  IMPRESSION:  1. 4 mm  obstructive stone within the mid right ureter with secondary  moderate hydroureteronephrosis proximally.  2. Additional bilateral nonobstructive renal calculi as above.  3. 3 mm left lower lobe pulmonary nodule. If the patient has no  underlying risk factors (i.e. smoking), no further followup is  needed. If there are underlying risk factors, a followup CT in 12  months could be considered to evaluate for interval change as  clinically desired.  Electronically Signed  By: Rise Mu M.D.  On: 04/24/2013 03:31   Assessment:  37 yo man in pain from shoulder and kidney stone who is asking for dilaudid. in pain from shoulder and kidney stone who is asking for dilaudid.  Dysuria - Plan: POCT urinalysis dipstick, POCT UA - Microscopic Only  Kidney stone - Plan: Ambulatory referral to Urology, ketorolac (TORADOL) injection 60 mg  Pain in joint, shoulder region, left - Plan: Ambulatory referral to Orthopedic Surgery  Signed, Elvina Sidle, MD

## 2013-04-30 ENCOUNTER — Encounter (HOSPITAL_COMMUNITY): Payer: Self-pay | Admitting: Emergency Medicine

## 2013-04-30 ENCOUNTER — Emergency Department (HOSPITAL_COMMUNITY)
Admission: EM | Admit: 2013-04-30 | Discharge: 2013-04-30 | Disposition: A | Discharge status: Home / Self Care | Payer: 59 | Attending: Emergency Medicine | Admitting: Emergency Medicine

## 2013-04-30 DIAGNOSIS — N23 Unspecified renal colic: Secondary | ICD-10-CM

## 2013-04-30 DIAGNOSIS — Z9889 Other specified postprocedural states: Secondary | ICD-10-CM | POA: Insufficient documentation

## 2013-04-30 DIAGNOSIS — Z79899 Other long term (current) drug therapy: Secondary | ICD-10-CM | POA: Insufficient documentation

## 2013-04-30 DIAGNOSIS — Z87442 Personal history of urinary calculi: Secondary | ICD-10-CM | POA: Insufficient documentation

## 2013-04-30 LAB — URINALYSIS, ROUTINE W REFLEX MICROSCOPIC
Bilirubin Urine: NEGATIVE
Glucose, UA: NEGATIVE mg/dL
Protein, ur: NEGATIVE mg/dL
Specific Gravity, Urine: 1.02 (ref 1.005–1.030)
Urobilinogen, UA: 0.2 mg/dL (ref 0.0–1.0)

## 2013-04-30 LAB — URINE MICROSCOPIC-ADD ON

## 2013-04-30 MED ORDER — HYDROMORPHONE HCL 4 MG PO TABS: 4.0000 mg | ORAL_TABLET | ORAL | Status: DC | PRN

## 2013-04-30 MED ORDER — HYDROMORPHONE HCL PF 2 MG/ML IJ SOLN
2.0000 mg | Freq: Once | INTRAMUSCULAR | Status: AC
  Administered 2013-04-30: 2 mg via INTRAMUSCULAR
  Filled 2013-04-30: qty 1

## 2013-04-30 NOTE — ED Notes (Signed)
Pt has flank pain from a kidney stone that he has had since Friday. Pt has had this in the past with same s/sx. Pt was seen in [facility] ED x2 since Friday. Pt received dilaudid that worked while he was home. Pt went to PCP and received Percocet which has not helped the pain. Pt is able to urinate without any problems.

## 2013-04-30 NOTE — ED Provider Notes (Signed)
CSN: [id number]     Arrival date & time 04/30/13  1611 History   First MD Initiated Contact with Patient 04/30/13 1851     Chief Complaint  Patient presents with  . Flank Pain   (Consider location/radiation/quality/duration/timing/severity/associated sxs/prior Treatment) The history is provided by the patient.  Michael Smith is a 37 y.o. male history of kidney stones status post lithotripsy here presenting with right flank pain. Came to the ER twice in last week for similar symptoms. He had CT abdomen pelvis that shows 4 mm stone on the right with hydro. Came in 4 days ago and had mild hydro on the right kidney on ultrasound. He states that the Dilaudid pills helped initially but he was prescribed Percocet by PMD and it didn't help. Denies any vomiting denies any fevers or pain with urination.    Past Medical History  Diagnosis Date  . Kidney stones    Past Surgical History  Procedure Laterality Date  . Lithotripsy     History reviewed. No pertinent family history. History  Substance Use Topics  . Smoking status: Never Smoker   . Smokeless tobacco: Never Used  . Alcohol Use: No    Review of Systems  Genitourinary: Positive for flank pain.  All other systems reviewed and are negative.    Allergies  Review of patient's allergies indicates no known allergies.  Home Medications   Current Outpatient Rx  Name  Route  Sig  Dispense  Refill  . HYDROmorphone (DILAUDID) 4 MG tablet   Oral   Take 1 tablet (4 mg total) by mouth every 4 (four) hours as needed for severe pain.   20 tablet   0   . ibuprofen (ADVIL,MOTRIN) 200 MG tablet   Oral   Take 200 mg by mouth every 8 (eight) hours as needed for moderate pain.          Marland Kitchen oxyCODONE-acetaminophen (PERCOCET) 7.5-325 MG per tablet   Oral   Take 1 tablet by mouth every 4 (four) hours as needed for pain.   10 tablet   0   . tamsulosin (FLOMAX) 0.4 MG CAPS capsule   Oral   Take 1 capsule (0.4 mg total) by mouth daily.  30 capsule   0    BP 164/106  Pulse 92  Temp(Src) 98.1 F (36.7 C)  SpO2 100% Physical Exam  Nursing note and vitals reviewed. Constitutional: He is oriented to person, place, and time.  Uncomfortable   HENT:  Head: Normocephalic.  Mouth/Throat: Oropharynx is clear and moist.  Eyes: Conjunctivae are normal. Pupils are equal, round, and reactive to light.  Neck: Normal range of motion. Neck supple.  Cardiovascular: Normal rate, regular rhythm and normal heart sounds.   Pulmonary/Chest: Effort normal and breath sounds normal. No respiratory distress. He has no wheezes. He has no rales.  Abdominal: Soft. Bowel sounds are normal.  + R CVAT. No RLQ tenderness   Musculoskeletal: Normal range of motion. He exhibits no edema and no tenderness.  Neurological: He is alert and oriented to person, place, and time.  Skin: Skin is warm and dry.  Psychiatric: He has a normal mood and affect. His behavior is normal. Judgment and thought content normal.    ED Course  Procedures (including critical care time) Labs Review Labs Reviewed  URINALYSIS, ROUTINE W REFLEX MICROSCOPIC - Abnormal; Notable for the following:    APPearance CLOUDY (*)    Hgb urine dipstick LARGE (*)    All other components within normal limits  URINE MICROSCOPIC-ADD ON   Imaging Review No results found.  EKG Interpretation   None       MDM  No diagnosis found. Michael Smith is a 37 y.o. male here with R flank pain. Likely renal colic. Labs several days ago normal. UA today showed no UTI. Not septic appearing. No need to repeat imaging. Pain improved with IM dilaudid. Will d/c home with prn dilaudid. He has urology f/u pending insurance.     Richardean Canal, MD 04/30/13 [phone]

## 2013-05-10 ENCOUNTER — Emergency Department (HOSPITAL_COMMUNITY)
Admission: EM | Admit: 2013-05-10 | Discharge: 2013-05-10 | Disposition: A | Discharge status: Home / Self Care | Payer: 59 | Attending: Emergency Medicine | Admitting: Emergency Medicine

## 2013-05-10 ENCOUNTER — Encounter (HOSPITAL_COMMUNITY): Payer: Self-pay | Admitting: Emergency Medicine

## 2013-05-10 DIAGNOSIS — R110 Nausea: Secondary | ICD-10-CM | POA: Insufficient documentation

## 2013-05-10 DIAGNOSIS — R231 Pallor: Secondary | ICD-10-CM | POA: Insufficient documentation

## 2013-05-10 DIAGNOSIS — R319 Hematuria, unspecified: Secondary | ICD-10-CM | POA: Insufficient documentation

## 2013-05-10 DIAGNOSIS — Z87442 Personal history of urinary calculi: Secondary | ICD-10-CM | POA: Insufficient documentation

## 2013-05-10 DIAGNOSIS — N23 Unspecified renal colic: Secondary | ICD-10-CM

## 2013-05-10 LAB — URINALYSIS, ROUTINE W REFLEX MICROSCOPIC
Bilirubin Urine: NEGATIVE
Glucose, UA: NEGATIVE mg/dL
Ketones, ur: NEGATIVE mg/dL
Leukocytes, UA: NEGATIVE
Nitrite: NEGATIVE
Protein, ur: NEGATIVE mg/dL
Specific Gravity, Urine: 1.022 (ref 1.005–1.030)
Urobilinogen, UA: 0.2 mg/dL (ref 0.0–1.0)
pH: 6 (ref 5.0–8.0)

## 2013-05-10 LAB — URINE MICROSCOPIC-ADD ON

## 2013-05-10 MED ORDER — SODIUM CHLORIDE 0.9 % IV SOLN
1000.0000 mL | Freq: Once | INTRAVENOUS | Status: AC
  Administered 2013-05-10: 1000 mL via INTRAVENOUS

## 2013-05-10 MED ORDER — OXYCODONE-ACETAMINOPHEN 5-325 MG PO TABS: ORAL_TABLET | ORAL | Status: DC

## 2013-05-10 MED ORDER — HYDROMORPHONE HCL PF 1 MG/ML IJ SOLN
1.0000 mg | Freq: Once | INTRAMUSCULAR | Status: AC
  Administered 2013-05-10: 1 mg via INTRAVENOUS
  Filled 2013-05-10: qty 1

## 2013-05-10 MED ORDER — ONDANSETRON HCL 4 MG/2ML IJ SOLN
4.0000 mg | Freq: Once | INTRAMUSCULAR | Status: AC
  Administered 2013-05-10: 4 mg via INTRAVENOUS
  Filled 2013-05-10: qty 2

## 2013-05-10 MED ORDER — KETOROLAC TROMETHAMINE 30 MG/ML IJ SOLN
30.0000 mg | Freq: Once | INTRAMUSCULAR | Status: AC
  Administered 2013-05-10: 30 mg via INTRAVENOUS
  Filled 2013-05-10: qty 1

## 2013-05-10 NOTE — ED Notes (Signed)
Per pt, has been seen for kidney stones-got another on the 24 th-saw MD in [place] and states he has 4 mm stone on right side-states increased pain-will be out of pain meds

## 2013-05-10 NOTE — Discharge Instructions (Signed)
 Ureteral Colic (Kidney Stones) Ureteral colic is the result of a condition when kidney stones form inside the kidney. Once kidney stones are formed they may move into the tube that connects the kidney with the bladder (ureter). If this occurs, this condition may cause pain (colic) in the ureter.  CAUSES  Pain is caused by stone movement in the ureter and the obstruction caused by the stone. SYMPTOMS  The pain comes and goes as the ureter contracts around the stone. The pain is usually intense, sharp, and stabbing in character. The location of the pain may move as the stone moves through the ureter. When the stone is near the kidney the pain is usually located in the back and radiates to the belly (abdomen). When the stone is ready to pass into the bladder the pain is often located in the lower abdomen on the side the stone is located. At this location, the symptoms may mimic those of a urinary tract infection with urinary frequency. Once the stone is located here it often passes into the bladder and the pain disappears completely. TREATMENT   Your caregiver will provide you with medicine for pain relief.  You may require specialized follow-up X-rays.  The absence of pain does not always mean that the stone has passed. It may have just stopped moving. If the urine remains completely obstructed, it can cause loss of kidney function or even complete destruction of the involved kidney. It is your responsibility and in your interest that X-rays and follow-ups as suggested by your caregiver are completed. Relief of pain without passage of the stone can be associated with severe damage to the kidney, including loss of kidney function on that side.  If your stone does not pass on its own, additional measures may be taken by your caregiver to ensure its removal. HOME CARE INSTRUCTIONS   Increase your fluid intake. Water is the preferred fluid since juices containing vitamin C may acidify the urine making it  less likely for certain stones (uric acid stones) to pass.  Strain all urine. A strainer will be provided. Keep all particulate matter or stones for your caregiver to inspect.  Take your pain medicine as directed.  Make a follow-up appointment with your caregiver as directed.  Remember that the goal is passage of your stone. The absence of pain does not mean the stone is gone. Follow your caregiver's instructions.  Only take over-the-counter or prescription medicines for pain, discomfort, or fever as directed by your caregiver. SEEK MEDICAL CARE IF:   Pain cannot be controlled with the prescribed medicine.  You have a fever.  Pain continues for longer than your caregiver advises it should.  There is a change in the pain, and you develop chest discomfort or constant abdominal pain.  You feel faint or pass out. MAKE SURE YOU:   Understand these instructions.  Will watch your condition.  Will get help right away if you are not doing well or get worse. Document Released: 02/07/2005 Document Revised: 08/25/2012 Document Reviewed: 10/25/2010 [facility] Patient Information 2014 [place], [place].   Narcotic and benzodiazepine use may cause drowsiness, slowed breathing or dependence.  Please use with caution and do not drive, operate machinery or watch young children alone while taking them.  Taking combinations of these medications or drinking alcohol will potentiate these effects.

## 2013-05-10 NOTE — ED Notes (Signed)
Bed: WA20 Expected date:  Expected time:  Means of arrival:  Comments: For triage 2

## 2013-05-10 NOTE — ED Provider Notes (Signed)
CSN: [id number]     Arrival date & time 05/10/13  1603 History   First MD Initiated Contact with Patient 05/10/13 1651     Chief Complaint  Patient presents with  . Nephrolithiasis   (Consider location/radiation/quality/duration/timing/severity/associated sxs/prior Treatment) HPI Comments: Patient with long-standing history of recurrent kidney stones likely do to family history, was seen here in emergency department about 2 weeks ago, have a 4 mm ureteral stone which he reports he passed last week. Patient's home town his in [place], recently was back home and began passing another stone. He reports seeing his urologist back home they did another study. He also has other small stones located in his kidney. He is back here in [place] where apparently he is slated to move once their home in [place] and sold. He has been taking appropriate medications including Percocet, ibuprofen, Flomax and a medication to decrease his calcium levels but continues to have pain. He is down to 1 tablet, and pain it got worse today. Nausea is present, no vomiting. No fever or chills. Pain is again located in his right flank with some radiation down to her groin area. He has been watching his urine to see if stone passes.  The history is provided by the patient and medical records.    Past Medical History  Diagnosis Date  . Kidney stones    Past Surgical History  Procedure Laterality Date  . Lithotripsy     History reviewed. No pertinent family history. History  Substance Use Topics  . Smoking status: Never Smoker   . Smokeless tobacco: Never Used  . Alcohol Use: No    Review of Systems  Constitutional: Negative for fever and chills.  Gastrointestinal: Positive for nausea.  Genitourinary: Positive for hematuria and flank pain.  All other systems reviewed and are negative.    Allergies  Review of patient's allergies indicates no known allergies.  Home Medications   Current Outpatient Rx   Name  Route  Sig  Dispense  Refill  . HYDROmorphone (DILAUDID) 4 MG tablet   Oral   Take 1 tablet (4 mg total) by mouth every 4 (four) hours as needed for severe pain.   20 tablet   0   . ibuprofen (ADVIL,MOTRIN) 200 MG tablet   Oral   Take 200 mg by mouth every 8 (eight) hours as needed for moderate pain.          Marland Kitchen oxyCODONE-acetaminophen (PERCOCET) 7.5-325 MG per tablet   Oral   Take 1 tablet by mouth every 4 (four) hours as needed for pain.   10 tablet   0   . tamsulosin (FLOMAX) 0.4 MG CAPS capsule   Oral   Take 1 capsule (0.4 mg total) by mouth daily.   30 capsule   0   . oxyCODONE-acetaminophen (PERCOCET/ROXICET) 5-325 MG per tablet      1-2 tablets po q 6 hours prn moderate to severe pain   20 tablet   0    BP 136/83  Pulse 96  Temp(Src) 98.7 F (37.1 C) (Oral)  Resp 16  SpO2 98% Physical Exam  Nursing note and vitals reviewed. Constitutional: He is oriented to person, place, and time. He appears well-developed and well-nourished. No distress.  HENT:  Head: Normocephalic and atraumatic.  Eyes: Conjunctivae and EOM are normal. No scleral icterus.  Cardiovascular: Normal rate, regular rhythm and intact distal pulses.   Pulmonary/Chest: Effort normal. No respiratory distress.  Abdominal: Soft. Normal appearance. He exhibits no distension. There  is no tenderness. There is CVA tenderness. There is no rebound and no guarding.  Neurological: He is alert and oriented to person, place, and time.  Skin: Skin is warm. He is not diaphoretic. There is pallor.  Psychiatric: He has a normal mood and affect.    ED Course  Procedures (including critical care time) Labs Review Labs Reviewed  URINALYSIS, ROUTINE W REFLEX MICROSCOPIC - Abnormal; Notable for the following:    APPearance CLOUDY (*)    Hgb urine dipstick LARGE (*)    All other components within normal limits  URINE MICROSCOPIC-ADD ON   Imaging Review No results found.  EKG Interpretation   None       Room air saturation is 100% and I interpret this to be normal  7:30 PM Pt felt improved, is starting to have pain return, but overall feels much improved.  I have encouraged that he follow up with a local urologist this week to consider stent placement.  Will give a refill of analgesics to last a few more days here, possibly stone may yet pass on its own.     MDM   1. Renal colic on right side      Patient with recurrent ureteral colic due 2 history of recurrent stones, reportedly has another known stone in his right ureter that is currently passing, here for pain control that is not well controlled on appropriate oral medications at home. Plan is to give IV parenteral analgesics, Toradol and Zofran and monitor for improvement of symptoms.    Gavin Pound. Oletta Lamas, MD 05/10/13 1610

## 2013-05-14 ENCOUNTER — Encounter (HOSPITAL_COMMUNITY): Payer: Self-pay | Admitting: Emergency Medicine

## 2013-05-14 ENCOUNTER — Emergency Department (HOSPITAL_COMMUNITY)
Admission: EM | Admit: 2013-05-14 | Discharge: 2013-05-15 | Disposition: A | Discharge status: Home / Self Care | Payer: 59 | Attending: Emergency Medicine | Admitting: Emergency Medicine

## 2013-05-14 DIAGNOSIS — R109 Unspecified abdominal pain: Secondary | ICD-10-CM | POA: Insufficient documentation

## 2013-05-14 DIAGNOSIS — Z87442 Personal history of urinary calculi: Secondary | ICD-10-CM | POA: Insufficient documentation

## 2013-05-14 LAB — URINE MICROSCOPIC-ADD ON

## 2013-05-14 LAB — URINALYSIS, ROUTINE W REFLEX MICROSCOPIC
Bilirubin Urine: NEGATIVE
GLUCOSE, UA: NEGATIVE mg/dL
Ketones, ur: NEGATIVE mg/dL
LEUKOCYTES UA: NEGATIVE
Nitrite: NEGATIVE
PH: 6 (ref 5.0–8.0)
Protein, ur: NEGATIVE mg/dL
Specific Gravity, Urine: 1.017 (ref 1.005–1.030)
Urobilinogen, UA: 0.2 mg/dL (ref 0.0–1.0)

## 2013-05-14 MED ORDER — HYDROMORPHONE HCL PF 1 MG/ML IJ SOLN
1.0000 mg | Freq: Once | INTRAMUSCULAR | Status: AC
  Administered 2013-05-14: 1 mg via INTRAVENOUS
  Filled 2013-05-14: qty 1

## 2013-05-14 MED ORDER — KETOROLAC TROMETHAMINE 30 MG/ML IJ SOLN
30.0000 mg | Freq: Once | INTRAMUSCULAR | Status: AC
  Administered 2013-05-14: 30 mg via INTRAVENOUS
  Filled 2013-05-14: qty 1

## 2013-05-14 NOTE — ED Notes (Signed)
Pt arrived to the Ed with a complaint of flank pain.  Pt has a dx of a kidney stone which is scheduled to be removed on Monday next.  Pt urologist states he was to present to the ED if pain was uncontrollable. Pt has been in pain as such since 0300 today

## 2013-05-14 NOTE — ED Notes (Signed)
Patient is alert and oriented x3.  He is complaining of kidney stone pain that he rates 8 of 10.  He currently has a 4mm stone stuck in his kidney and has an appointment for his urologist to remove it on the 13th.  He is here for pain management.

## 2013-05-15 ENCOUNTER — Emergency Department (HOSPITAL_COMMUNITY): Payer: 59

## 2013-05-15 MED ORDER — HYDROMORPHONE HCL 2 MG PO TABS: 2.0000 mg | ORAL_TABLET | ORAL | Status: DC | PRN

## 2013-05-15 MED ORDER — TAMSULOSIN HCL 0.4 MG PO CAPS: 0.4000 mg | ORAL_CAPSULE | Freq: Every day | ORAL | Status: DC

## 2013-05-15 NOTE — Discharge Instructions (Signed)

## 2013-05-15 NOTE — ED Notes (Signed)
Patient is alert and oriented x3.  He was given DC instructions and follow up visit instructions.  Patient gave verbal understanding.  He was DC ambulatory under his own power to home.  V/S stable.  He was not showing any signs of distress on DC 

## 2013-05-15 NOTE — ED Provider Notes (Signed)
CSN: [id number]     Arrival date & time 05/14/13  2044 History   First MD Initiated Contact with Patient 05/14/13 2307     Chief Complaint  Patient presents with  . Flank Pain   (Consider location/radiation/quality/duration/timing/severity/associated sxs/prior Treatment) HPI 38 year old male presents to emergency department with complaint of right flank pain.  Patient reports he has a 4 mm stone on the right.  He has scheduled with urology removal of stone.  A week from Tuesday.  He reports ibuprofen and Percocet.  Are not helping with symptoms.  He was told to come to the emergency department, if his pain was uncontrollable.  Patient reports told her that he received about 20 minutes ago helped briefly, but the pain is starting to return.  Patient had CT scan done in the emergency room on the 12th showed stone on the right side.  Patient has had 3 visits since that time for pain. Past Medical History  Diagnosis Date  . Kidney stones    Past Surgical History  Procedure Laterality Date  . Lithotripsy     History reviewed. No pertinent family history. History  Substance Use Topics  . Smoking status: Never Smoker   . Smokeless tobacco: Never Used  . Alcohol Use: No    Review of Systems  See History of Present Illness; otherwise all other systems are reviewed and negative Allergies  Review of patient's allergies indicates no known allergies.  Home Medications   Current Outpatient Rx  Name  Route  Sig  Dispense  Refill  . HYDROmorphone (DILAUDID) 4 MG tablet   Oral   Take 1 tablet (4 mg total) by mouth every 4 (four) hours as needed for severe pain.   20 tablet   0   . ibuprofen (ADVIL,MOTRIN) 200 MG tablet   Oral   Take 200 mg by mouth every 8 (eight) hours as needed for moderate pain.          Marland Kitchen oxyCODONE-acetaminophen (PERCOCET/ROXICET) 5-325 MG per tablet      1-2 tablets po q 6 hours prn moderate to severe pain   20 tablet   0   . tamsulosin (FLOMAX) 0.4 MG CAPS  capsule   Oral   Take 1 capsule (0.4 mg total) by mouth daily.   30 capsule   0    BP 160/109  Pulse 98  Temp(Src) 98.3 F (36.8 C) (Oral)  Resp 18  SpO2 100% Physical Exam  Nursing note and vitals reviewed. Constitutional: He is oriented to person, place, and time. He appears well-developed and well-nourished. He appears distressed (uncomfortable appearing).  HENT:  Head: Normocephalic and atraumatic.  Right Ear: External ear normal.  Left Ear: External ear normal.  Nose: Nose normal.  Mouth/Throat: Oropharynx is clear and moist.  Eyes: Conjunctivae and EOM are normal. Pupils are equal, round, and reactive to light.  Neck: Normal range of motion. Neck supple. No JVD present. No tracheal deviation present. No thyromegaly present.  Cardiovascular: Normal rate, regular rhythm, normal heart sounds and intact distal pulses.  Exam reveals no gallop and no friction rub.   No murmur heard. Pulmonary/Chest: Effort normal and breath sounds normal. No stridor. No respiratory distress. He has no wheezes. He has no rales. He exhibits no tenderness.  Abdominal: Soft. Bowel sounds are normal. He exhibits no distension and no mass. There is tenderness. There is no rebound and no guarding.  Right CVA tenderness  Musculoskeletal: Normal range of motion. He exhibits no edema and no  tenderness.  Lymphadenopathy:    He has no cervical adenopathy.  Neurological: He is alert and oriented to person, place, and time. He exhibits normal muscle tone. Coordination normal.  Skin: Skin is warm and dry. No rash noted. No erythema. No pallor.  Psychiatric: He has a normal mood and affect. His behavior is normal. Judgment and thought content normal.    ED Course  Procedures (including critical care time) Labs Review Labs Reviewed  URINALYSIS, ROUTINE W REFLEX MICROSCOPIC - Abnormal; Notable for the following:    Hgb urine dipstick LARGE (*)    All other components within normal limits  URINE  MICROSCOPIC-ADD ON   Imaging Review [facility]  05/15/2013   CLINICAL DATA:  Bilateral flank pain.  EXAM: RENAL/URINARY TRACT ULTRASOUND COMPLETE  COMPARISON:  04/26/2013  FINDINGS: Right Kidney:  Length: 12.2 cm. Diffuse parenchymal atrophy. No hydronephrosis. Echogenic focus in the upper pole suggesting 6 mm stone.  Left Kidney:  Length: 12.2 cm. Diffuse parenchymal atrophy. No hydronephrosis. Echogenic focus in the midpole suggesting 4 mm stone.  Bladder:  No bladder wall thickening or focal filling defects. Bilateral urine flow jets demonstrated. Prostate gland can be measured at 3 x 1.8 x 2.4 cm.  IMPRESSION: Bilateral renal parenchymal atrophy suggesting medical renal disease. Bilateral intrarenal stones demonstrated. No hydronephrosis.   Electronically Signed   By: Burman NievesWilliam  Stevens M.D.   On: 05/15/2013 01:20    EKG Interpretation   None       MDM   1. Flank pain    Pt now feeling better.  U/s without signs of hydronephrosis.  He has f/u scheduled.    Olivia Mackielga M Major [place], MD 05/15/13 [phone]

## 2013-05-18 ENCOUNTER — Encounter (HOSPITAL_COMMUNITY): Payer: Self-pay | Admitting: Emergency Medicine

## 2013-05-18 ENCOUNTER — Emergency Department (HOSPITAL_COMMUNITY)
Admission: EM | Admit: 2013-05-18 | Discharge: 2013-05-18 | Disposition: A | Discharge status: Home / Self Care | Payer: 59 | Attending: Emergency Medicine | Admitting: Emergency Medicine

## 2013-05-18 DIAGNOSIS — R231 Pallor: Secondary | ICD-10-CM | POA: Insufficient documentation

## 2013-05-18 DIAGNOSIS — R110 Nausea: Secondary | ICD-10-CM | POA: Insufficient documentation

## 2013-05-18 DIAGNOSIS — N200 Calculus of kidney: Secondary | ICD-10-CM | POA: Insufficient documentation

## 2013-05-18 DIAGNOSIS — Z79899 Other long term (current) drug therapy: Secondary | ICD-10-CM | POA: Insufficient documentation

## 2013-05-18 DIAGNOSIS — R000 Tachycardia, unspecified: Secondary | ICD-10-CM | POA: Insufficient documentation

## 2013-05-18 LAB — URINALYSIS, ROUTINE W REFLEX MICROSCOPIC
Bilirubin Urine: NEGATIVE
Glucose, UA: NEGATIVE mg/dL
Ketones, ur: 15 mg/dL — AB
LEUKOCYTES UA: NEGATIVE
Nitrite: NEGATIVE
Protein, ur: NEGATIVE mg/dL
SPECIFIC GRAVITY, URINE: 1.017 (ref 1.005–1.030)
UROBILINOGEN UA: 0.2 mg/dL (ref 0.0–1.0)
pH: 6 (ref 5.0–8.0)

## 2013-05-18 LAB — URINE MICROSCOPIC-ADD ON

## 2013-05-18 MED ORDER — ONDANSETRON HCL 4 MG/2ML IJ SOLN
4.0000 mg | Freq: Once | INTRAMUSCULAR | Status: AC
  Administered 2013-05-18: 4 mg via INTRAVENOUS
  Filled 2013-05-18: qty 2

## 2013-05-18 MED ORDER — OXYCODONE-ACETAMINOPHEN 5-325 MG PO TABS
1.0000 | ORAL_TABLET | Freq: Once | ORAL | Status: AC
  Administered 2013-05-18: 1 via ORAL

## 2013-05-18 MED ORDER — HYDROMORPHONE HCL PF 1 MG/ML IJ SOLN
1.0000 mg | Freq: Once | INTRAMUSCULAR | Status: AC
  Administered 2013-05-18: 1 mg via INTRAVENOUS
  Filled 2013-05-18: qty 1

## 2013-05-18 MED ORDER — OXYCODONE HCL 10 MG PO TABS: 10.0000 mg | ORAL_TABLET | Freq: Four times a day (QID) | ORAL | Status: DC | PRN

## 2013-05-18 MED ORDER — SODIUM CHLORIDE 0.9 % IV SOLN
Freq: Once | INTRAVENOUS | Status: AC
  Administered 2013-05-18: 21:00:00 via INTRAVENOUS

## 2013-05-18 MED ORDER — OXYCODONE-ACETAMINOPHEN 5-325 MG PO TABS
ORAL_TABLET | ORAL | Status: DC
Start: 2013-05-18 — End: 2013-05-19
  Filled 2013-05-18: qty 1

## 2013-05-18 MED ORDER — OXYCODONE HCL 5 MG PO TABS
10.0000 mg | ORAL_TABLET | Freq: Once | ORAL | Status: AC
  Administered 2013-05-18: 10 mg via ORAL
  Filled 2013-05-18: qty 2

## 2013-05-18 MED ORDER — KETOROLAC TROMETHAMINE 30 MG/ML IJ SOLN
30.0000 mg | Freq: Once | INTRAMUSCULAR | Status: AC
  Administered 2013-05-18: 30 mg via INTRAVENOUS
  Filled 2013-05-18: qty 1

## 2013-05-18 NOTE — ED Provider Notes (Signed)
CSN: [id number]     Arrival date & time 05/18/13  1557 History   First MD Initiated Contact with Patient 05/18/13 2026     Chief Complaint  Patient presents with  . Flank Pain   (Consider location/radiation/quality/duration/timing/severity/associated sxs/prior Treatment) HPI Comments: Patient with known Kidney stones Dx 12/14 passed one on the right was fine for a few days now having L flank pain not relieved by home Percocet and Ibuprofen, with nausea no vomiting   Patient is a 38 y.o. male presenting with flank pain. The history is provided by the patient.  Flank Pain This is a new problem. The current episode started today. The problem occurs constantly. The problem has been waxing and waning. Associated symptoms include abdominal pain and nausea. Pertinent negatives include no fever, urinary symptoms or vomiting. Nothing aggravates the symptoms. He has tried NSAIDs and oral narcotics for the symptoms. The treatment provided no relief.    Past Medical History  Diagnosis Date  . Kidney stones    Past Surgical History  Procedure Laterality Date  . Lithotripsy     History reviewed. No pertinent family history. History  Substance Use Topics  . Smoking status: Never Smoker   . Smokeless tobacco: Never Used  . Alcohol Use: No    Review of Systems  Constitutional: Negative for fever.  Gastrointestinal: Positive for nausea and abdominal pain. Negative for vomiting.  Genitourinary: Positive for flank pain. Negative for hematuria.  Skin: Positive for pallor.  All other systems reviewed and are negative.    Allergies  Review of patient's allergies indicates no known allergies.  Home Medications   Current Outpatient Rx  Name  Route  Sig  Dispense  Refill  . ibuprofen (ADVIL,MOTRIN) 200 MG tablet   Oral   Take 200 mg by mouth every 8 (eight) hours as needed for moderate pain.          Marland Kitchen oxyCODONE-acetaminophen (PERCOCET/ROXICET) 5-325 MG per tablet      1-2 tablets po q 6  hours prn moderate to severe pain   20 tablet   0   . tamsulosin (FLOMAX) 0.4 MG CAPS capsule   Oral   Take 1 capsule (0.4 mg total) by mouth daily.   30 capsule   0   . oxyCODONE 10 MG TABS   Oral   Take 1 tablet (10 mg total) by mouth every 6 (six) hours as needed for severe pain.   20 tablet   0    BP 127/72  Pulse 106  Temp(Src) 98.5 F (36.9 C) (Oral)  Resp 18  Wt 185 lb (83.915 kg)  SpO2 99% Physical Exam  Nursing note and vitals reviewed. Constitutional: He is oriented to person, place, and time. He appears well-developed and well-nourished.  HENT:  Head: Normocephalic.  Eyes: Pupils are equal, round, and reactive to light.  Neck: Normal range of motion.  Cardiovascular: Regular rhythm.  Tachycardia present.   Pulmonary/Chest: Effort normal and breath sounds normal.  Musculoskeletal: Normal range of motion.  Neurological: He is alert and oriented to person, place, and time.  Skin: Skin is warm. There is pallor.    ED Course  Procedures (including critical care time) Labs Review Labs Reviewed  URINALYSIS, ROUTINE W REFLEX MICROSCOPIC - Abnormal; Notable for the following:    APPearance CLOUDY (*)    Hgb urine dipstick LARGE (*)    Ketones, ur 15 (*)    All other components within normal limits  URINE MICROSCOPIC-ADD ON   Imaging  Review No results found.  EKG Interpretation   None       MDM   1. Kidney stone     Pain at DC 4/10 switched to PO Pt to FU with urology in AM    Arman FilterGail K Jester Klingberg, NP 05/18/13 2242

## 2013-05-18 NOTE — ED Notes (Signed)
Pt sts left sided flank pain that feels like kidney stone that has had in past since 0300 today

## 2013-05-18 NOTE — ED Provider Notes (Signed)
Medical screening examination/treatment/procedure(s) were conducted as a shared visit with non-physician practitioner(s) or resident and myself. I personally evaluated the patient during the encounter and agree with the findings and plan unless otherwise indicated.  I have personally reviewed any xrays and/ or EKG's with the provider and I agree with interpretation.  Left sided flank pain, acute onset, similar to previous stone which required removal. No vomiting, tolerating po. No focal abd pain. Well appearing, abd NT/ ND, mild tachycardia, mild dry mm. Pain meds, po fluids, UA.  Bedside US mild hydronephrosis.  Emergency Focused Ultrasound Exam Limited retroperitoneal ultrasound of kidneys  Performed and interpreted by Dr. Jodi MourningZavitz Indication: left flank pain Focused abdominal ultrasound with both kidneys imaged in transverse and longitudinal planes in real-time. Interpretation: mild hydronephrosis visualized.  no stones or cysts visualized  Images archived electronically  Pt okay to hold CT scan and close fup with urology. Pt feels improved after more pain medicines. Filed Vitals:   05/18/13 1621 05/18/13 2051  BP: 120/97 127/72  Pulse: 128 106  Temp: 98.3 F (36.8 C) 98.5 F (36.9 C)  TempSrc: Oral Oral  Resp: 17 18  Weight: 185 lb (83.915 kg)   SpO2: 93% 99%     Left flank pain   Enid SkeensJoshua M Darryle Dennie, MD 05/18/13 2318

## 2013-05-18 NOTE — Discharge Instructions (Signed)
Kidney Stones Kidney stones (urolithiasis) are solid masses that form inside your kidneys. The intense pain is caused by the stone moving through the kidney, ureter, bladder, and urethra (urinary tract). When the stone moves, the ureter starts to spasm around the stone. The stone is usually passed in your pee (urine).  HOME CARE  Drink enough fluids to keep your pee clear or pale yellow. This helps to get the stone out.  Strain all pee through the provided strainer. Do not pee without peeing through the strainer, not even once. If you pee the stone out, catch it in the strainer. The stone may be as small as a grain of salt. Take this to your doctor. This will help your doctor figure out what you can do to try to prevent more kidney stones.  Only take medicine as told by your doctor.  Follow up with your doctor as told.  Get follow-up X-rays as told by your doctor. GET HELP IF: You have pain that gets worse even if you have been taking pain medicine. GET HELP RIGHT AWAY IF:   Your pain does not get better with medicine.  You have a fever or shaking chills.  Your pain increases and gets worse over 18 hours.  You have new belly (abdominal) pain.  You feel faint or pass out.  You are unable to pee. MAKE SURE YOU:   Understand these instructions.  Will watch your condition.  Will get help right away if you are not doing well or get worse. Document Released: 10/17/2007 Document Revised: 12/31/2012 Document Reviewed: 10/01/2012 [employer] Patient Information 2014 [place]. Please call your urologist in the morning

## 2013-05-20 ENCOUNTER — Emergency Department (HOSPITAL_COMMUNITY)
Admission: EM | Admit: 2013-05-20 | Discharge: 2013-05-20 | Disposition: A | Discharge status: Home / Self Care | Payer: 59 | Attending: Emergency Medicine | Admitting: Emergency Medicine

## 2013-05-20 ENCOUNTER — Encounter (HOSPITAL_COMMUNITY): Payer: Self-pay | Admitting: Emergency Medicine

## 2013-05-20 ENCOUNTER — Emergency Department (HOSPITAL_COMMUNITY)
Admission: EM | Admit: 2013-05-20 | Discharge: 2013-05-20 | Discharge status: Against Medical Advice | Payer: 59 | Attending: Emergency Medicine | Admitting: Emergency Medicine

## 2013-05-20 DIAGNOSIS — N200 Calculus of kidney: Secondary | ICD-10-CM | POA: Insufficient documentation

## 2013-05-20 DIAGNOSIS — R1032 Left lower quadrant pain: Secondary | ICD-10-CM | POA: Insufficient documentation

## 2013-05-20 DIAGNOSIS — N2889 Other specified disorders of kidney and ureter: Secondary | ICD-10-CM | POA: Insufficient documentation

## 2013-05-20 DIAGNOSIS — Z79899 Other long term (current) drug therapy: Secondary | ICD-10-CM | POA: Insufficient documentation

## 2013-05-20 DIAGNOSIS — R109 Unspecified abdominal pain: Secondary | ICD-10-CM

## 2013-05-20 LAB — BASIC METABOLIC PANEL
BUN: 13 mg/dL (ref 6–23)
CALCIUM: 9.4 mg/dL (ref 8.4–10.5)
CO2: 26 mEq/L (ref 19–32)
Chloride: 99 mEq/L (ref 96–112)
Creatinine, Ser: 1.2 mg/dL (ref 0.50–1.35)
GFR calc Af Amer: 89 mL/min — ABNORMAL LOW (ref >90)
GFR, EST NON AFRICAN AMERICAN: 76 mL/min — AB (ref >90)
Glucose, Bld: 88 mg/dL (ref 70–99)
Potassium: 3.8 mEq/L (ref 3.7–5.3)
Sodium: 138 mEq/L (ref 137–147)

## 2013-05-20 LAB — URINALYSIS, ROUTINE W REFLEX MICROSCOPIC
BILIRUBIN URINE: NEGATIVE
Glucose, UA: NEGATIVE mg/dL
Ketones, ur: NEGATIVE mg/dL
Leukocytes, UA: NEGATIVE
Nitrite: NEGATIVE
PH: 5.5 (ref 5.0–8.0)
Protein, ur: NEGATIVE mg/dL
Specific Gravity, Urine: 1.021 (ref 1.005–1.030)
UROBILINOGEN UA: 0.2 mg/dL (ref 0.0–1.0)

## 2013-05-20 LAB — URINE MICROSCOPIC-ADD ON

## 2013-05-20 MED ORDER — KETOROLAC TROMETHAMINE 30 MG/ML IJ SOLN
30.0000 mg | Freq: Once | INTRAMUSCULAR | Status: AC
  Administered 2013-05-20: 30 mg via INTRAVENOUS
  Filled 2013-05-20: qty 1

## 2013-05-20 MED ORDER — OXYCODONE HCL 10 MG PO TABS: 10.0000 mg | ORAL_TABLET | Freq: Four times a day (QID) | ORAL | Status: DC | PRN

## 2013-05-20 MED ORDER — HYDROMORPHONE HCL PF 1 MG/ML IJ SOLN
1.0000 mg | Freq: Once | INTRAMUSCULAR | Status: AC
  Administered 2013-05-20: 1 mg via INTRAVENOUS
  Filled 2013-05-20: qty 1

## 2013-05-20 MED ORDER — SODIUM CHLORIDE 0.9 % IV BOLUS (SEPSIS)
1000.0000 mL | Freq: Once | INTRAVENOUS | Status: AC
  Administered 2013-05-20: 1000 mL via INTRAVENOUS

## 2013-05-20 MED ORDER — ONDANSETRON HCL 4 MG/2ML IJ SOLN
4.0000 mg | Freq: Once | INTRAMUSCULAR | Status: AC
  Administered 2013-05-20: 4 mg via INTRAVENOUS
  Filled 2013-05-20: qty 2

## 2013-05-20 NOTE — Discharge Instructions (Signed)
As discussed, it is important that you follow up as soon as possible with your physician for continued management of your condition. ° °If you develop any new, or concerning changes in your condition, please return to the emergency department immediately. ° °

## 2013-05-20 NOTE — ED Notes (Addendum)
Pt c/o lt flank pain since Monday morning.  Extensive hx of kidney stones.  States he has passed 10 of them.

## 2013-05-20 NOTE — ED Provider Notes (Signed)
CSN: [id number]     Arrival date & time 05/20/13  1822 History   First MD Initiated Contact with Patient 05/20/13 1912     Chief Complaint  Patient presents with  . Flank Pain  . Nephrolithiasis   HPI  Patient presents with concerns of left flank pain, left abdominal pain.  Pain has been present for 3 days.  Pain has been waxing/waning, has migrated towards the left inguinal crease.  There is associated hematuria.  Patient was seen by our colleagues for this pain.  Ultrasound did not demonstrate hydronephrosis at that time. He denies no fevers, chills, vomiting, diarrhea, confusion, disorientation.  He has an appointment with his urologist next week.   Past Medical History  Diagnosis Date  . Kidney stones    Past Surgical History  Procedure Laterality Date  . Lithotripsy     No family history on file. History  Substance Use Topics  . Smoking status: Never Smoker   . Smokeless tobacco: Never Used  . Alcohol Use: No    Review of Systems  Constitutional:       Per HPI, otherwise negative  HENT:       Per HPI, otherwise negative  Respiratory:       Per HPI, otherwise negative  Cardiovascular:       Per HPI, otherwise negative  Gastrointestinal: Negative for vomiting.  Endocrine:       Negative aside from HPI  Genitourinary:       Neg aside from HPI   Musculoskeletal:       Per HPI, otherwise negative  Skin: Negative.   Neurological: Negative for syncope.    Allergies  Review of patient's allergies indicates no known allergies.  Home Medications   Current Outpatient Rx  Name  Route  Sig  Dispense  Refill  . ibuprofen (ADVIL,MOTRIN) 200 MG tablet   Oral   Take 200 mg by mouth every 8 (eight) hours as needed for moderate pain.          Marland Kitchen oxyCODONE 10 MG TABS   Oral   Take 1 tablet (10 mg total) by mouth every 6 (six) hours as needed for severe pain.   20 tablet   0   . tamsulosin (FLOMAX) 0.4 MG CAPS capsule   Oral   Take 1 capsule (0.4 mg total) by  mouth daily.   30 capsule   0    BP 120/93  Pulse 97  Temp(Src) 97.8 F (36.6 C) (Oral)  Resp 18  SpO2 100% Physical Exam  Nursing note and vitals reviewed. Constitutional: He is oriented to person, place, and time. He appears well-developed. No distress.  HENT:  Head: Normocephalic and atraumatic.  Eyes: Conjunctivae and EOM are normal.  Cardiovascular: Normal rate and regular rhythm.   Pulmonary/Chest: Effort normal. No stridor. No respiratory distress.  Abdominal: He exhibits no distension. There is tenderness in the left lower quadrant. There is no rigidity, no rebound and no guarding.  Musculoskeletal: He exhibits no edema.  Neurological: He is alert and oriented to person, place, and time.  Skin: Skin is warm and dry.  Psychiatric: He has a normal mood and affect.    ED Course  Procedures (including critical care time) Labs Review Labs Reviewed  URINALYSIS, ROUTINE W REFLEX MICROSCOPIC - Abnormal; Notable for the following:    Hgb urine dipstick LARGE (*)    All other components within normal limits  URINE MICROSCOPIC-ADD ON  BASIC METABOLIC PANEL   Imaging Review No  results found.  EKG Interpretation   None      9:13 PM Patient appears comfortable.  He states that he feels better. MDM  No diagnosis found. Patient with recently diagnosed kidney stone presents with ongoing pain.  Patient is afebrile, with stable labs.  Patient had pain relief here, was appropriate for discharge with urology followup, as previously scheduled.    Gerhard Munchobert Ayrabella Labombard, MD 05/20/13 2113

## 2013-05-20 NOTE — ED Notes (Signed)
Called times 2 no answer and registration reported pt gave her his stickers and left

## 2013-05-24 ENCOUNTER — Encounter (HOSPITAL_COMMUNITY): Payer: Self-pay | Admitting: Emergency Medicine

## 2013-05-24 ENCOUNTER — Emergency Department (HOSPITAL_COMMUNITY)
Admission: EM | Admit: 2013-05-24 | Discharge: 2013-05-24 | Disposition: A | Discharge status: Home / Self Care | Payer: 59 | Attending: Emergency Medicine | Admitting: Emergency Medicine

## 2013-05-24 DIAGNOSIS — N200 Calculus of kidney: Secondary | ICD-10-CM | POA: Diagnosis not present

## 2013-05-24 DIAGNOSIS — Z87442 Personal history of urinary calculi: Secondary | ICD-10-CM | POA: Diagnosis not present

## 2013-05-24 DIAGNOSIS — N23 Unspecified renal colic: Secondary | ICD-10-CM | POA: Insufficient documentation

## 2013-05-24 DIAGNOSIS — R109 Unspecified abdominal pain: Secondary | ICD-10-CM | POA: Diagnosis present

## 2013-05-24 LAB — URINALYSIS, ROUTINE W REFLEX MICROSCOPIC
Bilirubin Urine: NEGATIVE
Glucose, UA: NEGATIVE mg/dL
KETONES UR: NEGATIVE mg/dL
LEUKOCYTES UA: NEGATIVE
Nitrite: NEGATIVE
Protein, ur: NEGATIVE mg/dL
Specific Gravity, Urine: 1.015 (ref 1.005–1.030)
Urobilinogen, UA: 0.2 mg/dL (ref 0.0–1.0)
pH: 8 (ref 5.0–8.0)

## 2013-05-24 LAB — URINE MICROSCOPIC-ADD ON

## 2013-05-24 MED ORDER — OXYCODONE-ACETAMINOPHEN 5-325 MG PO TABS: 1.0000 | ORAL_TABLET | Freq: Four times a day (QID) | ORAL | Status: DC | PRN

## 2013-05-24 MED ORDER — HYDROMORPHONE HCL PF 1 MG/ML IJ SOLN
1.0000 mg | Freq: Once | INTRAMUSCULAR | Status: AC
  Administered 2013-05-24: 1 mg via INTRAVENOUS
  Filled 2013-05-24: qty 1

## 2013-05-24 MED ORDER — OXYCODONE-ACETAMINOPHEN 5-325 MG PO TABS
2.0000 | ORAL_TABLET | Freq: Once | ORAL | Status: AC
  Administered 2013-05-24: 2 via ORAL
  Filled 2013-05-24: qty 2

## 2013-05-24 MED ORDER — KETOROLAC TROMETHAMINE 15 MG/ML IJ SOLN
15.0000 mg | Freq: Once | INTRAMUSCULAR | Status: AC
  Administered 2013-05-24: 15 mg via INTRAVENOUS
  Filled 2013-05-24: qty 1

## 2013-05-24 MED ORDER — OXYCODONE-ACETAMINOPHEN 5-325 MG PO TABS: 1.0000 | ORAL_TABLET | Freq: Once | ORAL | Status: DC

## 2013-05-24 NOTE — Discharge Instructions (Signed)
Follow up with your PCP if symptoms worsen. Call [facility] for follow up appointment. Return to ED if unable to urinate, develop fever/chills, or pain becomes unbearable.

## 2013-05-24 NOTE — ED Provider Notes (Signed)
CSN: [id number]     Arrival date & time 05/24/13  1239 History   First MD Initiated Contact with Patient 05/24/13 1243     Chief Complaint  Patient presents with  . Nephrolithiasis   (Consider location/radiation/quality/duration/timing/severity/associated sxs/prior Treatment) HPI 38 yo male with hx of kidney stones and recently diagnosed bilateral kidney stones. Patient presents today with worsening pain. Patient states pain is sharp 8/10 pain that has moved from his Left back around to his LLQ/flank area. Pain is constant. Patient denies radiation. Patient states he has urology appointment this upcoming week. Patient reports running out of pain medication this morning and is currently unable to tolerate pain. Denies fever, chills. Admits to dysuria and hematuria. Denies any other sxs.    Past Medical History  Diagnosis Date  . Kidney stones    Past Surgical History  Procedure Laterality Date  . Lithotripsy     No family history on file. History  Substance Use Topics  . Smoking status: Never Smoker   . Smokeless tobacco: Never Used  . Alcohol Use: No    Review of Systems  Gastrointestinal: Negative for nausea and vomiting.  Genitourinary: Negative for discharge, difficulty urinating, penile pain and testicular pain.  Musculoskeletal: Negative for back pain.  All other systems reviewed and are negative.    Allergies  Review of patient's allergies indicates no known allergies.  Home Medications   Current Outpatient Rx  Name  Route  Sig  Dispense  Refill  . ibuprofen (ADVIL,MOTRIN) 200 MG tablet   Oral   Take 200 mg by mouth every 8 (eight) hours as needed for moderate pain.          . Oxycodone HCl 10 MG TABS   Oral   Take 1 tablet (10 mg total) by mouth every 6 (six) hours as needed.   10 tablet   0   . tamsulosin (FLOMAX) 0.4 MG CAPS capsule   Oral   Take 1 capsule (0.4 mg total) by mouth daily.   30 capsule   0   . oxyCODONE-acetaminophen (PERCOCET) 5-325  MG per tablet   Oral   Take 1-2 tablets by mouth every 6 (six) hours as needed.   15 tablet   0    BP 133/94  Pulse 69  Temp(Src) 97.9 F (36.6 C) (Oral)  Resp 14  SpO2 99% Physical Exam  Nursing note and vitals reviewed. Constitutional: He is oriented to person, place, and time. He appears well-developed and well-nourished. No distress.  HENT:  Head: Normocephalic and atraumatic.  Cardiovascular: Normal rate and regular rhythm.   Pulmonary/Chest: Effort normal and breath sounds normal.  Abdominal: Soft. There is tenderness in the left lower quadrant. There is no guarding and no CVA tenderness.  Musculoskeletal: Normal range of motion.  Neurological: He is alert and oriented to person, place, and time.  Skin: Skin is warm and dry. He is not diaphoretic.  Psychiatric: He has a normal mood and affect. His behavior is normal.    ED Course  Procedures (including critical care time) Labs Review Labs Reviewed  URINALYSIS, ROUTINE W REFLEX MICROSCOPIC - Abnormal; Notable for the following:    Hgb urine dipstick LARGE (*)    All other components within normal limits  URINE MICROSCOPIC-ADD ON   Imaging Review No results found.  EKG Interpretation   None       MDM   1. Renal colic on left side    Patient afebrile with normal VS.  UA shows  large Hgb, not consistent with UTI. Suspect pain secondary to renal colic. Patient does not have acute abdomen.  Follow up with your PCP if symptoms worsen. [facility] for follow up appointment. Return to ED if unable to urinate, develop fever/chills, or pain becomes unbearable.   Meds given in ED:  Medications  ketorolac (TORADOL) 15 MG/ML injection 15 mg (15 mg Intravenous Given 05/24/13 1410)  HYDROmorphone (DILAUDID) injection 1 mg (1 mg Intravenous Given 05/24/13 1342)  oxyCODONE-acetaminophen (PERCOCET/ROXICET) 5-325 MG per tablet 2 tablet (2 tablets Oral Given 05/24/13 1453)    Discharge Medication List  as of 05/24/2013  2:48 PM         Rudene Anda, PA-C 05/24/13 1806

## 2013-05-24 NOTE — ED Notes (Signed)
Pt c/o lt flank pain since Saturday.  Pt is pale, diaphoretic.  Known kidney stone.

## 2013-05-25 NOTE — ED Provider Notes (Signed)
Medical screening examination/treatment/procedure(s) were conducted as a shared visit with non-physician practitioner(s) or resident  and myself.  I personally evaluated the patient during the encounter and agree with the findings and plan unless otherwise indicated.    I have personally reviewed any xrays and/ or EKG's with the provider and I agree with interpretation.   Known stones, worsening flank pain. No fevers.  Pt has close fup with urology and procedure planned later this week. Bedside US no hydronephrosis, medical renal dz seen.  Pt improved in ED with pain meds.  Abd soft/ mild Left lower flank tenderness. Well appearing in ED. Tolerating po.   DC with po pain meds and continued urology fup.   Renal colic, flank pain Labs Reviewed  URINALYSIS, ROUTINE W REFLEX MICROSCOPIC - Abnormal; Notable for the following:    Hgb urine dipstick LARGE (*)    All other components within normal limits  URINE MICROSCOPIC-ADD ON     Enid SkeensJoshua M Caylea Foronda, MD 05/25/13 [phone]

## 2013-05-27 ENCOUNTER — Emergency Department (HOSPITAL_COMMUNITY)
Admission: EM | Admit: 2013-05-27 | Discharge: 2013-05-28 | Disposition: A | Discharge status: Home / Self Care | Payer: 59 | Attending: Emergency Medicine | Admitting: Emergency Medicine

## 2013-05-27 ENCOUNTER — Encounter (HOSPITAL_COMMUNITY): Payer: Self-pay | Admitting: Emergency Medicine

## 2013-05-27 DIAGNOSIS — N201 Calculus of ureter: Secondary | ICD-10-CM

## 2013-05-27 DIAGNOSIS — Z9889 Other specified postprocedural states: Secondary | ICD-10-CM | POA: Diagnosis not present

## 2013-05-27 DIAGNOSIS — R109 Unspecified abdominal pain: Secondary | ICD-10-CM | POA: Diagnosis present

## 2013-05-27 LAB — URINALYSIS, ROUTINE W REFLEX MICROSCOPIC
BILIRUBIN URINE: NEGATIVE
Glucose, UA: NEGATIVE mg/dL
KETONES UR: NEGATIVE mg/dL
Leukocytes, UA: NEGATIVE
NITRITE: NEGATIVE
Protein, ur: NEGATIVE mg/dL
SPECIFIC GRAVITY, URINE: 1.027 (ref 1.005–1.030)
UROBILINOGEN UA: 0.2 mg/dL (ref 0.0–1.0)
pH: 6 (ref 5.0–8.0)

## 2013-05-27 LAB — URINE MICROSCOPIC-ADD ON

## 2013-05-27 MED ORDER — OXYCODONE-ACETAMINOPHEN 5-325 MG PO TABS
1.0000 | ORAL_TABLET | Freq: Once | ORAL | Status: AC
  Administered 2013-05-27: 1 via ORAL
  Filled 2013-05-27: qty 1

## 2013-05-27 MED ORDER — KETOROLAC TROMETHAMINE 60 MG/2ML IM SOLN
60.0000 mg | Freq: Once | INTRAMUSCULAR | Status: AC
  Administered 2013-05-27: 60 mg via INTRAMUSCULAR
  Filled 2013-05-27: qty 2

## 2013-05-27 MED ORDER — HYDROMORPHONE HCL PF 1 MG/ML IJ SOLN
1.0000 mg | Freq: Once | INTRAMUSCULAR | Status: AC
  Administered 2013-05-27: 1 mg via INTRAMUSCULAR
  Filled 2013-05-27: qty 1

## 2013-05-27 NOTE — ED Notes (Signed)
Pt in c/o right flank pain x5 hours, denies n/v, history of kidney stones and states this feels the same

## 2013-05-27 NOTE — ED Provider Notes (Signed)
CSN: [id number]     Arrival date & time 05/27/13  2002 History   First MD Initiated Contact with Patient 05/27/13 2259     Chief Complaint  Patient presents with  . Flank Pain   (Consider location/radiation/quality/duration/timing/severity/associated sxs/prior Treatment) HPI Comments: 38 year old male, history of kidney stones, over the last 2 months he has had multiple visits for ureterolithiasis and colic pain. He has had a CT scan and ultrasound imaging showing hydronephrosis, multiple intrarenal and ureteral stones. He has only had one kidney stone that required extraction, this was several years ago, he is currently following up with a urologist and undergoing 24-hour urine testing to evaluate for possible medication treatment possibilities to prevent ongoing symptoms. He states that approximately 5 hours ago he developed acute onset of right flank pain which was sharp, radiates to the right lower abdomen and is intermittent. This fluctuates in intensity, associated with hematuria but no nausea vomiting or diaphoresis.  Patient is a 38 y.o. male presenting with flank pain. The history is provided by the patient and medical records.  Flank Pain    Past Medical History  Diagnosis Date  . Kidney stones    Past Surgical History  Procedure Laterality Date  . Lithotripsy     History reviewed. No pertinent family history. History  Substance Use Topics  . Smoking status: Never Smoker   . Smokeless tobacco: Never Used  . Alcohol Use: No    Review of Systems  Genitourinary: Positive for flank pain.  All other systems reviewed and are negative.    Allergies  Review of patient's allergies indicates no known allergies.  Home Medications   Current Outpatient Rx  Name  Route  Sig  Dispense  Refill  . ibuprofen (ADVIL,MOTRIN) 200 MG tablet   Oral   Take 200 mg by mouth every 8 (eight) hours as needed for moderate pain.          . tamsulosin (FLOMAX) 0.4 MG CAPS capsule    Oral   Take 0.4 mg by mouth at bedtime.         Marland Kitchen. HYDROmorphone (DILAUDID) 2 MG tablet   Oral   Take 1 tablet (2 mg total) by mouth every 4 (four) hours as needed.   15 tablet   0   . naproxen (NAPROSYN) 500 MG tablet   Oral   Take 1 tablet (500 mg total) by mouth 2 (two) times daily with a meal.   30 tablet   0   . promethazine (PHENERGAN) 25 MG tablet   Oral   Take 1 tablet (25 mg total) by mouth every 6 (six) hours as needed for nausea or vomiting.   12 tablet   0   . tamsulosin (FLOMAX) 0.4 MG CAPS capsule   Oral   Take 1 capsule (0.4 mg total) by mouth 2 (two) times daily.   10 capsule   0    BP 144/107  Pulse 77  Temp(Src) 98.6 F (37 C) (Oral)  Resp 20  SpO2 100% Physical Exam  Nursing note and vitals reviewed. Constitutional: He appears well-developed and well-nourished.  HENT:  Head: Normocephalic and atraumatic.  Mouth/Throat: Oropharynx is clear and moist. No oropharyngeal exudate.  Eyes: Conjunctivae and EOM are normal. Pupils are equal, round, and reactive to light. Right eye exhibits no discharge. Left eye exhibits no discharge. No scleral icterus.  Neck: Normal range of motion. Neck supple. No JVD present. No thyromegaly present.  Cardiovascular: Normal rate, regular rhythm, normal heart sounds  and intact distal pulses.  Exam reveals no gallop and no friction rub.   No murmur heard. Pulmonary/Chest: Effort normal and breath sounds normal. No respiratory distress. He has no wheezes. He has no rales.  Abdominal: Soft. Bowel sounds are normal. He exhibits no distension and no mass. There is no tenderness.  CVA tenderness on the right  Musculoskeletal: Normal range of motion. He exhibits no edema and no tenderness.  Lymphadenopathy:    He has no cervical adenopathy.  Neurological: He is alert. Coordination normal.  Skin: Skin is warm and dry. No rash noted. No erythema.  Psychiatric: He has a normal mood and affect. His behavior is normal.    ED  Course  Procedures (including critical care time) Labs Review Labs Reviewed  URINALYSIS, ROUTINE W REFLEX MICROSCOPIC - Abnormal; Notable for the following:    APPearance CLOUDY (*)    Hgb urine dipstick LARGE (*)    All other components within normal limits  URINE MICROSCOPIC-ADD ON   Imaging Review No results found.  EKG Interpretation   None       MDM   1. Ureterolithiasis    The patient appears to have ureteral colic, he has no other abdominal tenderness, this makes a primary abdominal pathology less likely. Also review of the medical record confirms that he has had multiple kidney stones which he has been passing. This is likely recurrent ureterolithiasis with colic, no imaging is indicated at this time, the size of the stones on prior CT scans show that he should pass spontaneously. The patient is in agreement with pain control and followup end of voiding imaging at this time.  Urinalysis shows no signs of infection.  Pain improved with medications, vital signs stable  Patient will be given discharge medications including oral Dilaudid as his 10 mg oxycodone prescription is not helping.  Meds given in ED:  Medications  oxyCODONE-acetaminophen (PERCOCET/ROXICET) 5-325 MG per tablet 1 tablet (1 tablet Oral Given 05/27/13 2016)  ketorolac (TORADOL) injection 60 mg (60 mg Intramuscular Given 05/27/13 2327)  HYDROmorphone (DILAUDID) injection 1 mg (1 mg Intramuscular Given 05/27/13 2327)    New Prescriptions   HYDROMORPHONE (DILAUDID) 2 MG TABLET    Take 1 tablet (2 mg total) by mouth every 4 (four) hours as needed.   NAPROXEN (NAPROSYN) 500 MG TABLET    Take 1 tablet (500 mg total) by mouth 2 (two) times daily with a meal.   PROMETHAZINE (PHENERGAN) 25 MG TABLET    Take 1 tablet (25 mg total) by mouth every 6 (six) hours as needed for nausea or vomiting.   TAMSULOSIN (FLOMAX) 0.4 MG CAPS CAPSULE    Take 1 capsule (0.4 mg total) by mouth 2 (two) times daily.         Vida Roller, MD 05/28/13 [phone]

## 2013-05-28 MED ORDER — TAMSULOSIN HCL 0.4 MG PO CAPS: 0.4000 mg | ORAL_CAPSULE | Freq: Two times a day (BID) | ORAL | Status: DC

## 2013-05-28 MED ORDER — NAPROXEN 500 MG PO TABS: 500.0000 mg | ORAL_TABLET | Freq: Two times a day (BID) | ORAL | Status: DC

## 2013-05-28 MED ORDER — PROMETHAZINE HCL 25 MG PO TABS: 25.0000 mg | ORAL_TABLET | Freq: Four times a day (QID) | ORAL | Status: DC | PRN

## 2013-05-28 MED ORDER — HYDROMORPHONE HCL 2 MG PO TABS: 2.0000 mg | ORAL_TABLET | ORAL | Status: DC | PRN

## 2013-05-28 NOTE — ED Notes (Signed)
Pt given d/c instructions and verbalized understanding.  

## 2013-05-28 NOTE — Discharge Instructions (Signed)
Your exam and or your xrays have shown that you likely have a kidney stone.  You should follow up with the Urologist of your choosing or the Urologist listed above in the next 2-3 days if you have not passed the stone.  You should urinate in to the strainer until you pass the stone.    Flomax helps with passing the stone by opening up the Ureters (tubes), Vicodin and an antiinflammatory for pain if you are not allergic to these medicines.  Phenergan or Zofran for nausea.  Return to the ER for severe or worsening pain, vomiting or fevers or if you are unable to control your pain with the medicines provided.  Kidney Stones Kidney stones (ureteral lithiasis) are deposits that form inside your kidneys. The intense pain is caused by the stone moving through the urinary tract. When the stone moves, the ureter goes into spasm around the stone. The stone is usually passed in the urine.  CAUSES  A disorder that makes certain neck glands produce too much parathyroid hormone (primary hyperparathyroidism).  A buildup of uric acid crystals.  Narrowing (stricture) of the ureter.  A kidney obstruction present at birth (congenital obstruction).  Previous surgery on the kidney or ureters.  Numerous kidney infections.  SYMPTOMS  Feeling sick to your stomach (nauseous).  Throwing up (vomiting).  Blood in the urine (hematuria).  Pain that usually spreads (radiates) to the groin.  Frequency or urgency of urination.  DIAGNOSIS  Taking a history and physical exam.  Blood or urine tests.  Computerized X-ray scan (CT scan).  Occasionally, an examination of the inside of the urinary bladder (cystoscopy) is performed.  TREATMENT  Observation.  Increasing your fluid intake.  Surgery may be needed if you have severe pain or persistent obstruction.  The size, location, and chemical composition are all important variables that will determine the proper choice of action for you. Talk to your caregiver to better  understand your situation so that you will minimize the risk of injury to yourself and your kidney.  HOME CARE INSTRUCTIONS  Drink enough water and fluids to keep your urine clear or pale yellow.  Strain all urine through the provided strainer. Keep all particulate matter and stones for your caregiver to see. The stone causing the pain may be as small as a grain of salt. It is very important to use the strainer each and every time you pass your urine. The collection of your stone will allow your caregiver to analyze it and verify that a stone has actually passed.  Only take over-the-counter or prescription medicines for pain, discomfort, or fever as directed by your caregiver.  Make a follow-up appointment with your caregiver as directed.  Get follow-up X-rays if required. The absence of pain does not always mean that the stone has passed. It may have only stopped moving. If the urine remains completely obstructed, it can cause loss of kidney function or even complete destruction of the kidney. It is your responsibility to make sure X-rays and follow-ups are completed. Ultrasounds of the kidney can show blockages and the status of the kidney. Ultrasounds are not associated with any radiation and can be performed easily in a matter of minutes.  SEEK IMMEDIATE MEDICAL CARE IF:  Pain cannot be controlled with the prescribed medicine.  You have a fever.  The severity or intensity of pain increases over 18 hours and is not relieved by pain medicine.  You develop a new onset of abdominal pain.  You   feel faint or pass out.  MAKE SURE YOU:  Understand these instructions.  Will watch your condition.  Will get help right away if you are not doing well or get worse.  Document Released: 04/30/2005 Document Revised: 04/19/2011 Document Reviewed: 08/26/2009 [employer] Patient Information 2012 [employer].    

## 2013-05-31 ENCOUNTER — Emergency Department (HOSPITAL_COMMUNITY)
Admission: EM | Admit: 2013-05-31 | Discharge: 2013-05-31 | Disposition: A | Discharge status: Home / Self Care | Payer: 59 | Attending: Emergency Medicine | Admitting: Emergency Medicine

## 2013-05-31 DIAGNOSIS — N200 Calculus of kidney: Secondary | ICD-10-CM | POA: Diagnosis not present

## 2013-05-31 DIAGNOSIS — Z791 Long term (current) use of non-steroidal anti-inflammatories (NSAID): Secondary | ICD-10-CM | POA: Insufficient documentation

## 2013-05-31 DIAGNOSIS — R109 Unspecified abdominal pain: Secondary | ICD-10-CM | POA: Diagnosis present

## 2013-05-31 LAB — URINALYSIS, ROUTINE W REFLEX MICROSCOPIC
BILIRUBIN URINE: NEGATIVE
Glucose, UA: NEGATIVE mg/dL
Ketones, ur: NEGATIVE mg/dL
Nitrite: NEGATIVE
PH: 6 (ref 5.0–8.0)
Protein, ur: NEGATIVE mg/dL
Specific Gravity, Urine: 1.02 (ref 1.005–1.030)
Urobilinogen, UA: 0.2 mg/dL (ref 0.0–1.0)

## 2013-05-31 LAB — URINE MICROSCOPIC-ADD ON

## 2013-05-31 MED ORDER — KETOROLAC TROMETHAMINE 30 MG/ML IJ SOLN
30.0000 mg | Freq: Once | INTRAMUSCULAR | Status: AC
  Administered 2013-05-31: 30 mg via INTRAVENOUS
  Filled 2013-05-31: qty 1

## 2013-05-31 MED ORDER — SODIUM CHLORIDE 0.9 % IV BOLUS (SEPSIS)
1000.0000 mL | Freq: Once | INTRAVENOUS | Status: AC
  Administered 2013-05-31: 1000 mL via INTRAVENOUS

## 2013-05-31 MED ORDER — ONDANSETRON HCL 4 MG/2ML IJ SOLN
4.0000 mg | Freq: Once | INTRAMUSCULAR | Status: AC
  Administered 2013-05-31: 4 mg via INTRAVENOUS
  Filled 2013-05-31: qty 2

## 2013-05-31 MED ORDER — HYDROMORPHONE HCL PF 1 MG/ML IJ SOLN
1.0000 mg | Freq: Once | INTRAMUSCULAR | Status: AC
  Administered 2013-05-31: 1 mg via INTRAVENOUS
  Filled 2013-05-31: qty 1

## 2013-05-31 MED ORDER — HYDROMORPHONE HCL PF 1 MG/ML IJ SOLN
0.5000 mg | Freq: Once | INTRAMUSCULAR | Status: AC
  Administered 2013-05-31: 0.5 mg via INTRAVENOUS
  Filled 2013-05-31: qty 1

## 2013-05-31 MED ORDER — PROMETHAZINE HCL 25 MG RE SUPP: 25.0000 mg | Freq: Four times a day (QID) | RECTAL | Status: DC | PRN

## 2013-05-31 NOTE — ED Notes (Addendum)
Pt reports right flank pain 8/10, treated on 1/14 for left sided kidney stone. This is the 3rd stone in 1.5 months. Pt went to urgent care and was given toradol with no relief. Pain at present 8/10. Denies n/v.

## 2013-05-31 NOTE — ED Provider Notes (Signed)
CSN: [id number]     Arrival date & time 05/31/13  1513 History   First MD Initiated Contact with Patient 05/31/13 1655     Chief Complaint  Patient presents with  . Flank Pain   (Consider location/radiation/quality/duration/timing/severity/associated sxs/prior Treatment) Patient is a 38 y.o. male presenting with flank pain. The history is provided by the patient. No language interpreter was used.  Flank Pain This is a new problem. The current episode started today. The problem occurs constantly. Associated symptoms include nausea and vomiting. Pertinent negatives include no chills or fever. Associated symptoms comments: History of kidney stones with multiple recent ureteral stones. Seen by urology with stone analysis pending. No fever. Nausea with vomiting. He went to Urgent Care prior to arrival and received Toradol which usually works, per patient. He continued to have significant pain and nausea and was referred to the ED.Marland Kitchen    Past Medical History  Diagnosis Date  . Kidney stones    Past Surgical History  Procedure Laterality Date  . Lithotripsy     No family history on file. History  Substance Use Topics  . Smoking status: Never Smoker   . Smokeless tobacco: Never Used  . Alcohol Use: No    Review of Systems  Constitutional: Negative for fever and chills.  HENT: Negative.   Respiratory: Negative.   Cardiovascular: Negative.   Gastrointestinal: Positive for nausea and vomiting.  Genitourinary: Positive for flank pain.  Musculoskeletal: Negative.   Skin: Negative.   Neurological: Negative.     Allergies  Review of patient's allergies indicates no known allergies.  Home Medications   Current Outpatient Rx  Name  Route  Sig  Dispense  Refill  . HYDROmorphone (DILAUDID) 2 MG tablet   Oral   Take 1 tablet (2 mg total) by mouth every 4 (four) hours as needed.   15 tablet   0   . ibuprofen (ADVIL,MOTRIN) 200 MG tablet   Oral   Take 200 mg by mouth every 8 (eight)  hours as needed for moderate pain.          . naproxen (NAPROSYN) 500 MG tablet   Oral   Take 1 tablet (500 mg total) by mouth 2 (two) times daily with a meal.   30 tablet   0   . tamsulosin (FLOMAX) 0.4 MG CAPS capsule   Oral   Take 0.4 mg by mouth at bedtime.          BP 170/136  Pulse 100  Temp(Src) 98.1 F (36.7 C) (Oral)  Resp 16  SpO2 100% Physical Exam  Constitutional: He is oriented to person, place, and time. He appears well-developed and well-nourished.  Neck: Normal range of motion.  Pulmonary/Chest: Effort normal.  Abdominal: Soft. There is no tenderness. There is no rebound.  Genitourinary:  No reproducible CVA tenderness.  Musculoskeletal: Normal range of motion.  Neurological: He is alert and oriented to person, place, and time.  Skin: Skin is warm and dry.  Psychiatric: He has a normal mood and affect.    ED Course  Procedures (including critical care time) Labs Review Labs Reviewed  URINALYSIS, ROUTINE W REFLEX MICROSCOPIC   Results for orders placed during the hospital encounter of 05/31/13  URINALYSIS, ROUTINE W REFLEX MICROSCOPIC      Result Value Range   Color, Urine YELLOW  YELLOW   APPearance CLEAR  CLEAR   Specific Gravity, Urine 1.020  1.005 - 1.030   pH 6.0  5.0 - 8.0   Glucose,  UA NEGATIVE  NEGATIVE mg/dL   Hgb urine dipstick LARGE (*) NEGATIVE   Bilirubin Urine NEGATIVE  NEGATIVE   Ketones, ur NEGATIVE  NEGATIVE mg/dL   Protein, ur NEGATIVE  NEGATIVE mg/dL   Urobilinogen, UA 0.2  0.0 - 1.0 mg/dL   Nitrite NEGATIVE  NEGATIVE   Leukocytes, UA TRACE (*) NEGATIVE  URINE MICROSCOPIC-ADD ON      Result Value Range   Squamous Epithelial / LPF RARE  RARE   WBC, UA 0-2  <3 WBC/hpf   RBC / HPF TOO NUMEROUS TO COUNT  <3 RBC/hpf   Urine-Other MUCOUS PRESENT      Imaging Review No results found.  EKG Interpretation   None       MDM  No diagnosis found. 1. Kidney stones  Reviewed patient charts: Renal US x 2 showing  intrarenal stones CT (stone study) also showing presence of stones Hematuria today, pain transiently controlled with IV medications.  Complicating course of care is a review of [place] Controlled Substance data base showing excessive Rx's for narcotic pain relievers from multiple providers: Between 05/15/13 and 05/25/13 he received 117 pain pills of various strengths Between 04/17/14 and 05/12/14 he received 231 pain pills.  [place]  Discussed with patient who states he has medications at home but was unable to hold them down. Pain is better here with Dilaudid and Toradol. Stable for discharge. Has scheduled follow up with Dr. Berneice HeinrichManny on Wednesday of this week and will keep that appointment. Return precautions discussed.   Arnoldo HookerShari A Cordaro Mukai, PA-C 05/31/13 2041

## 2013-05-31 NOTE — ED Provider Notes (Signed)
Medical screening examination/treatment/procedure(s) were performed by non-physician practitioner and as supervising physician I was immediately available for consultation/collaboration.    Nelia Shiobert L Envi Eagleson, MD 05/31/13 [phone]

## 2013-05-31 NOTE — Discharge Instructions (Signed)
Kidney Stones °Kidney stones (urolithiasis) are deposits that form inside your kidneys. The intense pain is caused by the stone moving through the urinary tract. When the stone moves, the ureter goes into spasm around the stone. The stone is usually passed in the urine.  °CAUSES  °· A disorder that makes certain neck glands produce too much parathyroid hormone (primary hyperparathyroidism). °· A buildup of uric acid crystals, similar to gout in your joints. °· Narrowing (stricture) of the ureter. °· A kidney obstruction present at birth (congenital obstruction). °· Previous surgery on the kidney or ureters. °· Numerous kidney infections. °SYMPTOMS  °· Feeling sick to your stomach (nauseous). °· Throwing up (vomiting). °· Blood in the urine (hematuria). °· Pain that usually spreads (radiates) to the groin. °· Frequency or urgency of urination. °DIAGNOSIS  °· Taking a history and physical exam. °· Blood or urine tests. °· CT scan. °· Occasionally, an examination of the inside of the urinary bladder (cystoscopy) is performed. °TREATMENT  °· Observation. °· Increasing your fluid intake. °· Extracorporeal shock wave lithotripsy This is a noninvasive procedure that uses shock waves to break up kidney stones. °· Surgery may be needed if you have severe pain or persistent obstruction. There are various surgical procedures. Most of the procedures are performed with the use of small instruments. Only small incisions are needed to accommodate these instruments, so recovery time is minimized. °The size, location, and chemical composition are all important variables that will determine the proper choice of action for you. Talk to your health care provider to better understand your situation so that you will minimize the risk of injury to yourself and your kidney.  °HOME CARE INSTRUCTIONS  °· Drink enough water and fluids to keep your urine clear or pale yellow. This will help you to pass the stone or stone fragments. °· Strain  all urine through the provided strainer. Keep all particulate matter and stones for your health care provider to see. The stone causing the pain may be as small as a grain of salt. It is very important to use the strainer each and every time you pass your urine. The collection of your stone will allow your health care provider to analyze it and verify that a stone has actually passed. The stone analysis will often identify what you can do to reduce the incidence of recurrences. °· Only take over-the-counter or prescription medicines for pain, discomfort, or fever as directed by your health care provider. °· Make a follow-up appointment with your health care provider as directed. °· Get follow-up X-rays if required. The absence of pain does not always mean that the stone has passed. It may have only stopped moving. If the urine remains completely obstructed, it can cause loss of kidney function or even complete destruction of the kidney. It is your responsibility to make sure X-rays and follow-ups are completed. Ultrasounds of the kidney can show blockages and the status of the kidney. Ultrasounds are not associated with any radiation and can be performed easily in a matter of minutes. °SEEK MEDICAL CARE IF: °· You experience pain that is progressive and unresponsive to any pain medicine you have been prescribed. °SEEK IMMEDIATE MEDICAL CARE IF:  °· Pain cannot be controlled with the prescribed medicine. °· You have a fever or shaking chills. °· The severity or intensity of pain increases over 18 hours and is not relieved by pain medicine. °· You develop a new onset of abdominal pain. °· You feel faint or pass   out. °· You are unable to urinate. °MAKE SURE YOU:  °· Understand these instructions. °· Will watch your condition. °· Will get help right away if you are not doing well or get worse. °Document Released: 04/30/2005 Document Revised: 12/31/2012 Document Reviewed: 10/01/2012 °ExitCare® Patient Information ©2014  [employer]. ° °Diet for Kidney Stones °Kidney stones are small, hard masses that form inside your kidneys. They are made up of salts and minerals and often form when high levels build up in the urine. The minerals can then start to build up, crystalize, and stick together to form stones. There are several different types of kidney stones. The following types of stones may be influenced by dietary factors:  °· Calcium Oxalate Stones. An oxalate is a salt found in certain foods. Within the body, calcium can combine with oxalates to form calcium oxalate stones, which can be excreted in the urine in high amounts. This is the most common type of kidney stone. °· Calcium Phosphate Stones. These stones may occur when the pH of the urine becomes too high, or less acidic, from too much calcium being excreted in the urine. The pH is a measure of how acidic or basic a substance is. °· Uric Acid Stones. This type of stone occurs when the pH of the urine becomes too low, or very acidic, because substances called purines build up in the urine. Purines are found in animal proteins. When the urine is highly concentrated with acid, uric acid kidney stones can form.   °Other risk factors for kidney stones include genetics, environment, and being overweight. Your caregiver may ask you to follow specific diet guidelines based on the type of stone you have to lessen the chances of your body making more kidney stones.  °GENERAL GUIDELINES FOR ALL TYPES OF STONES °· Drink plenty of fluid. Drink 12 16 cups of fluid a day, drinking mainly water. This is the most important thing you can do to prevent the formation of future kidney stones. °· Maintain a healthy weight. Your caregiver or dietitian can help you determine what a healthy weight is for you. If you are overweight, weight loss may help prevent the formation of future kidney stones. °· Eat a diet adequate in animal protein. Too much animal protein can contribute to the formation  of stones. Your dietitian can help you determine how much protein you should be eating. Avoid low carbohydrate, high protein diets. °· Follow a balanced eating approach. The DASH diet, which stands for "Dietary Approaches to Stop Hypertension," is an effective meal plan for reducing stone formation. This diet is high in fruits, vegetables, dairy, and whole grains and low in animal protein. Ask your caregiver or dietitian for information about the DASH diet. °ADDITIONAL DIET GUIDELINES FOR CALCIUM STONES °Avoid foods high in salt. This includes table salt, salt seasonings, MSG, soy sauce, cured and processed meats, salted crackers and snack foods, fast food, and canned soups and foods. Ask your caregiver or dietitian for information about reducing sodium in your diet or following the low sodium diet.  °Ensure adequate calcium intake. Use the following table for calcium guidelines: °· Men 65 years old and younger  1000 mg/day. °· Men 65 years old and older  1500 mg/day. °· Women 25 37 years old  1000 mg/day. °· Women 50 years and older  1500 mg/day. °Your dietitian can help you determine if you are getting enough calcium in your diet. Foods that are high in calcium include dairy products, broccoli, cheese, yogurt, and   pudding. If you need to take a calcium supplement, take it only in the form of calcium citrate.  °Avoid foods high in oxalate. Be sure that any supplements you take do not contain more than 500 mg of vitamin C. Vitamin C is converted into oxalate in the body. You do not need to avoid fruits and vegetables high in vitamin C.  °· Grains: High-fiber or bran cereal, whole-wheat bread, grits, barley, buckwheat, amaranth, pretzels, and fruitcake. °· Vegetables: Dried beans, wax beans, dark leafy greens, eggplant, leeks, okra, parsley, rutabaga, tomato paste, watercress, zucchini, and escarole. °· Fruit: Dried apricots, red currants, figs, kiwi, and rhubarb. °· Meat and Meat Substitutes: Soybeans and foods made  from soy (soyburger, miso), dried beans, peanut butter. °· Milk: Chocolate milk mixes and soymilk. °· Fats and Oils: Nuts (peanuts, almonds, pecans, cashews, hazelnuts) and nut butters, sesame seeds, and tDahini paste. °· Condiments/Miscellaneous: Chocolate, carob, marmalade, poppy seeds, instant iced tea, and juice from high-oxalate fruits.    °Document Released: 08/25/2010 Document Revised: 10/30/2011 Document Reviewed: 10/15/2011 °ExitCare® Patient Information ©2014 [employer]. ° °

## 2013-05-31 NOTE — ED Notes (Signed)
Awaiting Toradol to be sent up from Pharmacy, pt aware.

## 2013-06-02 LAB — URINE CULTURE
COLONY COUNT: NO GROWTH
CULTURE: NO GROWTH

## 2013-06-05 ENCOUNTER — Encounter: Payer: Self-pay | Admitting: Physical Medicine & Rehabilitation

## 2013-06-05 ENCOUNTER — Encounter: Payer: 59 | Attending: Physical Medicine & Rehabilitation

## 2013-06-05 ENCOUNTER — Ambulatory Visit (HOSPITAL_BASED_OUTPATIENT_CLINIC_OR_DEPARTMENT_OTHER): Payer: 59 | Admitting: Physical Medicine & Rehabilitation

## 2013-06-05 VITALS — BP 174/94 | HR 101 | Resp 14 | Ht 69.0 in | Wt 188.0 lb

## 2013-06-05 DIAGNOSIS — M25519 Pain in unspecified shoulder: Secondary | ICD-10-CM

## 2013-06-05 DIAGNOSIS — Z79899 Other long term (current) drug therapy: Secondary | ICD-10-CM

## 2013-06-05 DIAGNOSIS — Z5181 Encounter for therapeutic drug level monitoring: Secondary | ICD-10-CM

## 2013-06-05 DIAGNOSIS — X58XXXA Exposure to other specified factors, initial encounter: Secondary | ICD-10-CM | POA: Diagnosis not present

## 2013-06-05 DIAGNOSIS — S43429A Sprain of unspecified rotator cuff capsule, initial encounter: Secondary | ICD-10-CM | POA: Diagnosis not present

## 2013-06-05 DIAGNOSIS — S46919A Strain of unspecified muscle, fascia and tendon at shoulder and upper arm level, unspecified arm, initial encounter: Secondary | ICD-10-CM

## 2013-06-05 DIAGNOSIS — S46819A Strain of other muscles, fascia and tendons at shoulder and upper arm level, unspecified arm, initial encounter: Secondary | ICD-10-CM | POA: Insufficient documentation

## 2013-06-05 NOTE — Progress Notes (Signed)
Subjective:    Patient ID: Michael Smith, male    DOB: 07/31/1975, 38 y.o.   MRN: [id number]  HPI 1.5 years of right shoulder pain, initial injury playing softball. Review of MRI of the right shoulder 2013 showed partial thickness right infraspinatus tear. Was doing well until about 2 months ago when he was lifting furniture, immediate onset and had to drop the couch. X-ray at that time was negative No neck pain no pain with neck range of motion.  Head good results with physical therapy in [place]. Has not had any physical therapy since this new injury. The pain feels like it is in the same spot as the prior injury but the pain feels a bit worse.  Has pain with overhead activity, no pain with writing. Has pain with pushing  No numbness or tingling in the hand.  Motrin 600 mg every 8 hours is partially helpful. Heat is helpful using it a couple times a week.  Runs about 2 miles every other day.  Pain Inventory Average Pain 5 Pain Right Now 3 My pain is sharp and stabbing  In the last 24 hours, has pain interfered with the following? General activity 5 Relation with others 5 Enjoyment of life 5 What TIME of day is your pain at its worst? evening Sleep (in general) Poor  Pain is worse with: some activites Pain improves with: heat/ice and medication Relief from Meds: 5  Mobility ability to climb steps?  yes do you drive?  yes Do you have any goals in this area?  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  yes  Neuro/Psych No problems in this area  Prior Studies CT/MRI  Physicians involved in your care Primary care Dr Laqueta Carina [place]   No family history on file. History   Social History  . Marital Status: Married    Spouse Name: N/A    Number of Children: N/A  . Years of Education: N/A   Social History Main Topics  . Smoking status: Never Smoker   . Smokeless tobacco: Never Used  . Alcohol Use: No  . Drug  Use: No  . Sexual Activity: None   Other Topics Concern  . None   Social History Narrative  . None   Past Surgical History  Procedure Laterality Date  . Lithotripsy     Past Medical History  Diagnosis Date  . Kidney stones    BP 174/121  Pulse 101  Resp 14  Ht 5\' 9"  (1.753 m)  Wt 188 lb (85.276 kg)  BMI 27.75 kg/m2  SpO2 99%     Review of Systems  Musculoskeletal: Positive for joint swelling.  All other systems reviewed and are negative.       Objective:   Physical Exam Inspection: Mild infraspinatus fossa atrophy on the right side only Positive Hawkins right shoulder Deltoid 4 minus give way secondary to pain in the right upper extremity also has 4 minus strength in the biceps and triceps secondary to pain Left upper extremities 5/5 strength No pain with neck range of motion negative foraminal compression test  Sensory normal light touch in bilateral upper limbs.       Assessment & Plan:  1. Right shoulder pain 2 months duration sudden onset in a patient with a known infraspinatus tear. His pain is mainly anterior and also is worsened by elbow flexion and extension rather than impingement only. May have multiple pain generators. Suspect biceps  tendinopathy plus minus completion of infraspinatus tear vs. labral tear. Patient states that he cannot afford an MRI of the current time secondary to detectable. We will use musculoskeletal ultrasound to further evaluate prior to initiating physical therapy. If he fails this then would recommend referral to orthopedics. He has had corticosteroid injections to the right shoulder in the past with only temporary relief. Given history of partial tear would not want to pursue this.  He will continue over-the-counter analgesics. Do not think narcotic analgesics are indicated in this situation

## 2013-06-05 NOTE — Patient Instructions (Signed)
Will do shoulder ultrasound prior to PT

## 2013-06-11 ENCOUNTER — Emergency Department (HOSPITAL_COMMUNITY): Payer: 59

## 2013-06-11 ENCOUNTER — Emergency Department (HOSPITAL_COMMUNITY)
Admission: EM | Admit: 2013-06-11 | Discharge: 2013-06-11 | Disposition: A | Discharge status: Home / Self Care | Payer: 59 | Attending: Emergency Medicine | Admitting: Emergency Medicine

## 2013-06-11 ENCOUNTER — Encounter (HOSPITAL_COMMUNITY): Payer: Self-pay | Admitting: Emergency Medicine

## 2013-06-11 DIAGNOSIS — R109 Unspecified abdominal pain: Secondary | ICD-10-CM

## 2013-06-11 DIAGNOSIS — N200 Calculus of kidney: Secondary | ICD-10-CM | POA: Insufficient documentation

## 2013-06-11 DIAGNOSIS — Z9889 Other specified postprocedural states: Secondary | ICD-10-CM | POA: Insufficient documentation

## 2013-06-11 DIAGNOSIS — Z79899 Other long term (current) drug therapy: Secondary | ICD-10-CM | POA: Insufficient documentation

## 2013-06-11 LAB — URINALYSIS, ROUTINE W REFLEX MICROSCOPIC
Bilirubin Urine: NEGATIVE
Glucose, UA: NEGATIVE mg/dL
Ketones, ur: NEGATIVE mg/dL
LEUKOCYTES UA: NEGATIVE
Nitrite: NEGATIVE
PH: 6 (ref 5.0–8.0)
Protein, ur: NEGATIVE mg/dL
Specific Gravity, Urine: 1.026 (ref 1.005–1.030)
Urobilinogen, UA: 0.2 mg/dL (ref 0.0–1.0)

## 2013-06-11 LAB — POCT I-STAT, CHEM 8
BUN: 9 mg/dL (ref 6–23)
CALCIUM ION: 1.14 mmol/L (ref 1.12–1.23)
Chloride: 105 mEq/L (ref 96–112)
Creatinine, Ser: 1.1 mg/dL (ref 0.50–1.35)
Glucose, Bld: 100 mg/dL — ABNORMAL HIGH (ref 70–99)
HCT: 45 % (ref 39.0–52.0)
Hemoglobin: 15.3 g/dL (ref 13.0–17.0)
Potassium: 3.5 mEq/L — ABNORMAL LOW (ref 3.7–5.3)
Sodium: 141 mEq/L (ref 137–147)
TCO2: 21 mmol/L (ref 0–100)

## 2013-06-11 LAB — URINE MICROSCOPIC-ADD ON

## 2013-06-11 MED ORDER — HYDROMORPHONE HCL PF 1 MG/ML IJ SOLN
1.0000 mg | Freq: Once | INTRAMUSCULAR | Status: AC
  Administered 2013-06-11: 1 mg via INTRAVENOUS
  Filled 2013-06-11: qty 1

## 2013-06-11 MED ORDER — OXYCODONE-ACETAMINOPHEN 5-325 MG PO TABS: 1.0000 | ORAL_TABLET | Freq: Four times a day (QID) | ORAL | Status: DC | PRN

## 2013-06-11 MED ORDER — OXYCODONE-ACETAMINOPHEN 5-325 MG PO TABS
2.0000 | ORAL_TABLET | Freq: Once | ORAL | Status: AC
  Administered 2013-06-11: 2 via ORAL
  Filled 2013-06-11: qty 2

## 2013-06-11 MED ORDER — KETOROLAC TROMETHAMINE 30 MG/ML IJ SOLN
30.0000 mg | Freq: Once | INTRAMUSCULAR | Status: AC
  Administered 2013-06-11: 30 mg via INTRAVENOUS
  Filled 2013-06-11: qty 1

## 2013-06-11 MED ORDER — POTASSIUM CHLORIDE CRYS ER 20 MEQ PO TBCR
40.0000 meq | EXTENDED_RELEASE_TABLET | Freq: Once | ORAL | Status: AC
  Administered 2013-06-11: 40 meq via ORAL
  Filled 2013-06-11: qty 2

## 2013-06-11 NOTE — ED Notes (Signed)
US at bedside

## 2013-06-11 NOTE — ED Provider Notes (Signed)
Medical screening examination/treatment/procedure(s) were performed by non-physician practitioner and as supervising physician I was immediately available for consultation/collaboration.    Olivia Mackielga M Senie Lanese, MD 06/11/13 [phone]

## 2013-06-11 NOTE — ED Provider Notes (Signed)
CSN: [id number]     Arrival date & time 06/11/13  0028 History   First MD Initiated Contact with Patient 06/11/13 0335     Chief Complaint  Patient presents with  . Flank Pain   (Consider location/radiation/quality/duration/timing/severity/associated sxs/prior Treatment) HPI Comments: Patient is a 38 year old male with a history of multiple bilateral intrarenal stones who presents for right flank pain radiating to the right groin. Patient states the symptoms have been ongoing over the last 3 days. He describes a constant ache in his right flank with intermittent in wavelike sharp pains radiating to his right inguinal region. Patient denies any modifying factors of his symptoms and has been taking ibuprofen and Flomax with only mild and intermittent relief. He endorses associated hematuria and denies fever, chest pain, shortness of breath, nausea or vomiting, diarrhea, dysuria, and numbness/tingling.   He is followed by Dr. Berneice HeinrichManny of [facility] and states he has followup next week. He has a history of stent placement following stone extraction with basket retrieval for a 9 mm stone as well as lithotripsy. He endorses a plan for ureteral stents given persistent flank pain over the past 2 months.  Patient is a 38 y.o. male presenting with flank pain. The history is provided by the patient. No language interpreter was used.  Flank Pain Associated symptoms include abdominal pain. Pertinent negatives include no chest pain, fever, nausea, numbness, vomiting or weakness.    Past Medical History  Diagnosis Date  . Kidney stones    Past Surgical History  Procedure Laterality Date  . Lithotripsy    . Stone extraction with basket     No family history on file. History  Substance Use Topics  . Smoking status: Never Smoker   . Smokeless tobacco: Never Used  . Alcohol Use: No    Review of Systems  Constitutional: Negative for fever.  Respiratory: Negative for shortness of breath.    Cardiovascular: Negative for chest pain.  Gastrointestinal: Positive for abdominal pain. Negative for nausea and vomiting.  Genitourinary: Positive for hematuria and flank pain. Negative for dysuria, penile swelling, scrotal swelling, penile pain and testicular pain.  Neurological: Negative for weakness and numbness.  All other systems reviewed and are negative.    Allergies  Review of patient's allergies indicates no known allergies.  Home Medications   Current Outpatient Rx  Name  Route  Sig  Dispense  Refill  . ibuprofen (ADVIL,MOTRIN) 200 MG tablet   Oral   Take 200 mg by mouth every 8 (eight) hours as needed for moderate pain.          . tamsulosin (FLOMAX) 0.4 MG CAPS capsule   Oral   Take 0.4 mg by mouth at bedtime.         Marland Kitchen. oxyCODONE-acetaminophen (PERCOCET/ROXICET) 5-325 MG per tablet   Oral   Take 1-2 tablets by mouth every 6 (six) hours as needed for moderate pain or severe pain.   13 tablet   0    BP 135/81  Pulse 67  Temp(Src) 98.2 F (36.8 C) (Oral)  Resp 16  Ht 5\' 9"  (1.753 m)  Wt 190 lb (86.183 kg)  BMI 28.05 kg/m2  SpO2 98%  Physical Exam  Nursing note and vitals reviewed. Constitutional: He is oriented to person, place, and time. He appears well-developed and well-nourished. No distress.  Patient in fetal position in bed, but in no audible discomfort. NAD noted.  HENT:  Head: Normocephalic and atraumatic.  Eyes: Conjunctivae and EOM are normal. No  scleral icterus.  Neck: Normal range of motion.  Cardiovascular: Normal rate, regular rhythm and normal heart sounds.   Pulmonary/Chest: Effort normal and breath sounds normal. No respiratory distress. He has no wheezes. He has no rales.  Abdominal: Soft. He exhibits no distension and no mass. There is tenderness (R CVA and R inguinal). There is no rebound and no guarding.  No peritoneal signs or guarding.  Musculoskeletal: Normal range of motion.  Neurological: He is alert and oriented to  person, place, and time.  Skin: Skin is warm and dry. No rash noted. He is not diaphoretic. No erythema. No pallor.  Psychiatric: He has a normal mood and affect. His behavior is normal.    ED Course  Procedures (including critical care time) Labs Review Labs Reviewed  URINALYSIS, ROUTINE W REFLEX MICROSCOPIC - Abnormal; Notable for the following:    Hgb urine dipstick LARGE (*)    All other components within normal limits  URINE MICROSCOPIC-ADD ON - Abnormal; Notable for the following:    Crystals CA OXALATE CRYSTALS (*)    All other components within normal limits  POCT I-STAT, CHEM 8 - Abnormal; Notable for the following:    Potassium 3.5 (*)    Glucose, Bld 100 (*)    All other components within normal limits   Imaging Review US Renal  06/11/2013   CLINICAL DATA:  Flank pain (right side)  EXAM: RENAL/URINARY TRACT ULTRASOUND COMPLETE  COMPARISON:  05/15/2013 ultrasound, 04/24/2013 CT.  FINDINGS: Right Kidney:  Length: Ram 11.3 cm. Echogenicity within normal limits. No mass or hydronephrosis visualized.  Left Kidney:  Length: 11.3 cm. Echogenicity within normal limits. No mass or hydronephrosis visualized.  Bladder:  Decompressed.  IMPRESSION: Renal ultrasound within normal limits. No hydronephrosis. The patient's known tiny stones are not visualized on today's study (likely technical though interval passage of stone not excluded).   Electronically Signed   By: Jearld Lesch M.D.   On: 06/11/2013 05:59    EKG Interpretation   None       MDM   1. Kidney stones   2. Right flank pain    38 year old male well-known to the emergency department recently for persistent flank pain. Patient with known bilateral intrarenal kidney stones. He has a history of basket retrieval as well as lithotripsy and is followed by Dr. Berneice Heinrich of [facility].  Patient well and nontoxic appearing, hemodynamically stable, and afebrile. Physical exam significant for tenderness to palpation of the  right flank as well as right inguinal region. No peritoneal signs or guarding. Patient's kidney function is preserved today. UA with TNTC RBCs and without evidence of infection. Renal ultrasound shows no evidence of hydronephrosis; no stones visualized. Patient has had improvement in his flank pain over ED course with Toradol and Dilaudid. Pain improved from 7/10 to 4/10. He states he feels comfortable managing his symptoms further at home. Will discharge with course of Percocet and instruction to followup with his urologist as soon as possible. Patient already taking Flomax and ibuprofen. Return precautions provided and patient agreeable to plan with no unaddressed concerns.   Filed Vitals:   06/11/13 0038 06/11/13 0553  BP: 144/92 135/81  Pulse: 107 67  Temp: 98.2 F (36.8 C)   TempSrc: Oral   Resp: 16 16  Height: 5\' 9"  (1.753 m)   Weight: 190 lb (86.183 kg)   SpO2: 100% 98%      Antony Madura, PA-C 06/11/13 1610

## 2013-06-11 NOTE — ED Notes (Signed)
Pt c/o R flank pain radiating to R groin onset 2 days ago, known kidney stones, unable to get into urology until next week.

## 2013-06-11 NOTE — Discharge Instructions (Signed)
Kidney Stones °Kidney stones (urolithiasis) are solid masses that form inside your kidneys. The intense pain is caused by the stone moving through the kidney, ureter, bladder, and urethra (urinary tract). When the stone moves, the ureter starts to spasm around the stone. The stone is usually passed in your pee (urine).  °HOME CARE °· Drink enough fluids to keep your pee clear or pale yellow. This helps to get the stone out. °· Strain all pee through the provided strainer. Do not pee without peeing through the strainer, not even once. If you pee the stone out, catch it in the strainer. The stone may be as small as a grain of salt. Take this to your doctor. This will help your doctor figure out what you can do to try to prevent more kidney stones. °· Only take medicine as told by your doctor. °· Follow up with your doctor as told. °· Get follow-up X-rays as told by your doctor. °GET HELP IF: °You have pain that gets worse even if you have been taking pain medicine. °GET HELP RIGHT AWAY IF:  °· Your pain does not get better with medicine. °· You have a fever or shaking chills. °· Your pain increases and gets worse over 18 hours. °· You have new belly (abdominal) pain. °· You feel faint or pass out. °· You are unable to pee. °MAKE SURE YOU:  °· Understand these instructions. °· Will watch your condition. °· Will get help right away if you are not doing well or get worse. °Document Released: 10/17/2007 Document Revised: 12/31/2012 Document Reviewed: 10/01/2012 °ExitCare® Patient Information ©2014 [employer]. ° °Diet for Kidney Stones °Kidney stones are small, hard masses that form inside your kidneys. They are made up of salts and minerals and often form when high levels build up in the urine. The minerals can then start to build up, crystalize, and stick together to form stones. There are several different types of kidney stones. The following types of stones may be influenced by dietary factors:  °· Calcium Oxalate  Stones. An oxalate is a salt found in certain foods. Within the body, calcium can combine with oxalates to form calcium oxalate stones, which can be excreted in the urine in high amounts. This is the most common type of kidney stone. °· Calcium Phosphate Stones. These stones may occur when the pH of the urine becomes too high, or less acidic, from too much calcium being excreted in the urine. The pH is a measure of how acidic or basic a substance is. °· Uric Acid Stones. This type of stone occurs when the pH of the urine becomes too low, or very acidic, because substances called purines build up in the urine. Purines are found in animal proteins. When the urine is highly concentrated with acid, uric acid kidney stones can form.   °Other risk factors for kidney stones include genetics, environment, and being overweight. Your caregiver may ask you to follow specific diet guidelines based on the type of stone you have to lessen the chances of your body making more kidney stones.  °GENERAL GUIDELINES FOR ALL TYPES OF STONES °· Drink plenty of fluid. Drink 12 16 cups of fluid a day, drinking mainly water. This is the most important thing you can do to prevent the formation of future kidney stones. °· Maintain a healthy weight. Your caregiver or dietitian can help you determine what a healthy weight is for you. If you are overweight, weight loss may help prevent the formation of future   kidney stones. °· Eat a diet adequate in animal protein. Too much animal protein can contribute to the formation of stones. Your dietitian can help you determine how much protein you should be eating. Avoid low carbohydrate, high protein diets. °· Follow a balanced eating approach. The DASH diet, which stands for "Dietary Approaches to Stop Hypertension," is an effective meal plan for reducing stone formation. This diet is high in fruits, vegetables, dairy, and whole grains and low in animal protein. Ask your caregiver or dietitian for  information about the DASH diet. °ADDITIONAL DIET GUIDELINES FOR CALCIUM STONES °Avoid foods high in salt. This includes table salt, salt seasonings, MSG, soy sauce, cured and processed meats, salted crackers and snack foods, fast food, and canned soups and foods. Ask your caregiver or dietitian for information about reducing sodium in your diet or following the low sodium diet.  °Ensure adequate calcium intake. Use the following table for calcium guidelines: °· Men 65 years old and younger  1000 mg/day. °· Men 65 years old and older  1500 mg/day. °· Women 25 37 years old  1000 mg/day. °· Women 50 years and older  1500 mg/day. °Your dietitian can help you determine if you are getting enough calcium in your diet. Foods that are high in calcium include dairy products, broccoli, cheese, yogurt, and pudding. If you need to take a calcium supplement, take it only in the form of calcium citrate.  °Avoid foods high in oxalate. Be sure that any supplements you take do not contain more than 500 mg of vitamin C. Vitamin C is converted into oxalate in the body. You do not need to avoid fruits and vegetables high in vitamin C.  °· Grains: High-fiber or bran cereal, whole-wheat bread, grits, barley, buckwheat, amaranth, pretzels, and fruitcake. °· Vegetables: Dried beans, wax beans, dark leafy greens, eggplant, leeks, okra, parsley, rutabaga, tomato paste, watercress, zucchini, and escarole. °· Fruit: Dried apricots, red currants, figs, kiwi, and rhubarb. °· Meat and Meat Substitutes: Soybeans and foods made from soy (soyburger, miso), dried beans, peanut butter. °· Milk: Chocolate milk mixes and soymilk. °· Fats and Oils: Nuts (peanuts, almonds, pecans, cashews, hazelnuts) and nut butters, sesame seeds, and tDahini paste. °· Condiments/Miscellaneous: Chocolate, carob, marmalade, poppy seeds, instant iced tea, and juice from high-oxalate fruits.    °Document Released: 08/25/2010 Document Revised: 10/30/2011 Document Reviewed:  10/15/2011 °ExitCare® Patient Information ©2014 [employer]. ° °

## 2013-06-14 ENCOUNTER — Ambulatory Visit (INDEPENDENT_AMBULATORY_CARE_PROVIDER_SITE_OTHER): Payer: 59 | Admitting: Family Medicine

## 2013-06-14 VITALS — BP 128/80 | HR 88 | Temp 98.0°F | Resp 16 | Ht 69.0 in | Wt 191.0 lb

## 2013-06-14 DIAGNOSIS — R319 Hematuria, unspecified: Secondary | ICD-10-CM

## 2013-06-14 DIAGNOSIS — N200 Calculus of kidney: Secondary | ICD-10-CM

## 2013-06-14 DIAGNOSIS — R109 Unspecified abdominal pain: Secondary | ICD-10-CM

## 2013-06-14 LAB — POCT URINALYSIS DIPSTICK
BILIRUBIN UA: NEGATIVE
GLUCOSE UA: NEGATIVE
Ketones, UA: NEGATIVE
LEUKOCYTES UA: NEGATIVE
NITRITE UA: NEGATIVE
Protein, UA: NEGATIVE
Spec Grav, UA: 1.02
Urobilinogen, UA: 0.2
pH, UA: 5.5

## 2013-06-14 LAB — POCT CBC
Granulocyte percent: 80 %G (ref 37–80)
HCT, POC: 49.6 % (ref 43.5–53.7)
Hemoglobin: 15.9 g/dL (ref 14.1–18.1)
LYMPH, POC: 1.6 (ref 0.6–3.4)
MCH, POC: 30.1 pg (ref 27–31.2)
MCHC: 32.1 g/dL (ref 31.8–35.4)
MCV: 94 fL (ref 80–97)
MID (cbc): 0.4 (ref 0–0.9)
MPV: 9.9 fL (ref 0–99.8)
POC Granulocyte: 7.9 — AB (ref 2–6.9)
POC LYMPH %: 16.4 % (ref 10–50)
POC MID %: 3.6 %M (ref 0–12)
Platelet Count, POC: 356 10*3/uL (ref 142–424)
RBC: 5.28 M/uL (ref 4.69–6.13)
RDW, POC: 14.6 %
WBC: 9.9 10*3/uL (ref 4.6–10.2)

## 2013-06-14 LAB — POCT UA - MICROSCOPIC ONLY
Bacteria, U Microscopic: NEGATIVE
Casts, Ur, LPF, POC: NEGATIVE
Crystals, Ur, HPF, POC: NEGATIVE
Epithelial cells, urine per micros: NEGATIVE
Mucus, UA: NEGATIVE
WBC, UR, HPF, POC: NEGATIVE
YEAST UA: NEGATIVE

## 2013-06-14 MED ORDER — KETOROLAC TROMETHAMINE 60 MG/2ML IM SOLN
60.0000 mg | Freq: Once | INTRAMUSCULAR | Status: AC
  Administered 2013-06-14: 60 mg via INTRAMUSCULAR

## 2013-06-14 MED ORDER — OXYCODONE-ACETAMINOPHEN 5-325 MG PO TABS: 1.0000 | ORAL_TABLET | Freq: Four times a day (QID) | ORAL | Status: DC | PRN

## 2013-06-14 NOTE — Progress Notes (Addendum)
Subjective:   This chart was scribed for Ethelda Chick, MD by Arlan Organ, [facility] Scribe. This patient was seen in room 2 and the patient's care was started 3:52 PM.    Patient ID: Michael Smith, male    DOB: 03/27/1976, 38 y.o.   MRN: [id number]  HPI  HPI Comments: Michael Smith is a 38 y.o. male who presents to [facility] complaining of right sided lower extremity pain that extends into his groin that initially started 3 days ago. Pt has been seen 8 times in the month of January for kidney stones. His most recent visit was 1/29 where he presented to ED with right sided flank pain, and was treated with pain medication. An ultrasound with normal findings and no hydronephrosis was performed. Pt had a CT performed on 12/12 confirming multiple non obstructive stones to the left kidney, largest being 4 mm. In addition, a 4 mm stone was noted in the right ureter with mild hydronephrosis. He has tried his prescribed Flomax, Ibuprofen 600 mg, and Percocet 5-325 mg with no improvement. Pt states his last dosage of Percocet was this morning; he ran out of Percocet this morning. At this time he denies fever, chills, flank pain, diaphoresis, testicular swelling polyuria, diarrhea, constipation, or dysuria. He plans to have stents placed in five days with Dr. Kathrynn Running of Urology to help him pass the current stones he has.    Additionally, pt was seen here at [facility] 12/5, 12/7, and 12/9 for chronic right shoulder pain and was referred to ortho at time of discharge.  In notes, overuse of narcotics was noted from ortho in [place].  S/p evaluation by ortho; recommended repeat MRI shoulder but patient cannot afford due to high deductible; no narcotics prescribed by   Dr. Wynn Banker at pain management.  Recommend ultrasound of R shoulder to evaluate further.  Pt is scheduled for ultrasound this week.   He is followed by Dr. Kathrynn Running of Urology, last visit was 1 month ago.   Scheduled for stent placement in five days by Kathrynn Running; Dr. Kathrynn Running has prescribed pain medication on one occasion.    Review of Systems  Constitutional: Negative for fever and diaphoresis.  Gastrointestinal: Positive for abdominal pain. Negative for nausea, vomiting, constipation and abdominal distention.  Endocrine: Negative for polyuria.  Genitourinary: Positive for urgency, frequency and hematuria. Negative for dysuria, flank pain, decreased urine volume, penile swelling, scrotal swelling, penile pain and testicular pain.       Pain to groin area  Musculoskeletal: Positive for arthralgias and myalgias (Right lower extremity pain).    Past Medical History  Diagnosis Date  . Kidney stones     Past Surgical History  Procedure Laterality Date  . Lithotripsy    . Stone extraction with basket      History   Social History  . Marital Status: Married    Spouse Name: N/A    Number of Children: N/A  . Years of Education: N/A   Occupational History  . Not on file.   Social History Main Topics  . Smoking status: Never Smoker   . Smokeless tobacco: Never Used  . Alcohol Use: No  . Drug Use: No  . Sexual Activity: Not on file   Other Topics Concern  . Not on file   Social History Narrative  . No narrative on file    Triage Vitals: BP 128/80  Pulse 88  Temp(Src) 98 F (36.7 C) (Oral)  Resp 16  Ht 5\' 9"  (1.753 m)  Wt 191 lb (86.637 kg)  BMI 28.19 kg/m2  SpO2 99%   Objective:  Physical Exam  Nursing note and vitals reviewed. Constitutional: He is oriented to person, place, and time. He appears well-developed and well-nourished. He appears distressed.  Pt distressed during physical exam  HENT:  Head: Normocephalic and atraumatic.  Eyes: EOM are normal.  Neck: Normal range of motion.  Cardiovascular: Regular rhythm and normal heart sounds.  Tachycardia present.  Exam reveals no gallop and no friction rub.   No murmur heard. Tachycardic  Pulmonary/Chest: Effort  normal.  Abdominal: Soft. Bowel sounds are normal. He exhibits no distension and no mass. There is tenderness. There is no rigidity, no rebound, no guarding and no CVA tenderness. Hernia confirmed negative in the right inguinal area and confirmed negative in the left inguinal area.  Genitourinary: Testes normal and penis normal. Right testis shows no mass, no swelling and no tenderness. Left testis shows no mass, no swelling and no tenderness. Circumcised.  No testicular pain  Musculoskeletal: Normal range of motion.       Lumbar back: He exhibits normal range of motion, no tenderness, no bony tenderness, no pain and no spasm.  FROM of lumbar spine without limitation  Lymphadenopathy:       Right: No inguinal adenopathy present.       Left: No inguinal adenopathy present.  Neurological: He is alert and oriented to person, place, and time.  Skin: Skin is warm and dry. He is not diaphoretic.  Psychiatric: He has a normal mood and affect. His behavior is normal.   Results for orders placed in visit on 06/14/13  POCT UA - MICROSCOPIC ONLY      Result Value Range   WBC, Ur, HPF, POC neg     RBC, urine, microscopic tntc     Bacteria, U Microscopic neg     Mucus, UA neg     Epithelial cells, urine per micros neg     Crystals, Ur, HPF, POC neg     Casts, Ur, LPF, POC neg     Yeast, UA neg    POCT URINALYSIS DIPSTICK      Result Value Range   Color, UA yellowish pink     Clarity, UA clear     Glucose, UA neg     Bilirubin, UA neg     Ketones, UA neg     Spec Grav, UA 1.020     Blood, UA large     pH, UA 5.5     Protein, UA neg     Urobilinogen, UA 0.2     Nitrite, UA neg     Leukocytes, UA Negative    POCT CBC      Result Value Range   WBC 9.9  4.6 - 10.2 K/uL   Lymph, poc 1.6  0.6 - 3.4   POC LYMPH PERCENT 16.4  10 - 50 %L   MID (cbc) 0.4  0 - 0.9   POC MID % 3.6  0 - 12 %M   POC Granulocyte 7.9 (*) 2 - 6.9   Granulocyte percent 80.0  37 - 80 %G   RBC 5.28  4.69 - 6.13 M/uL    Hemoglobin 15.9  14.1 - 18.1 g/dL   HCT, POC 16.1  09.6 - 53.7 %   MCV 94.0  80 - 97 fL   MCH, POC 30.1  27 - 31.2 pg   MCHC 32.1  31.8 - 35.4 g/dL   RDW, POC 16.114.6     Platelet Count, POC 356  142 - 424 K/uL   MPV 9.9  0 - 99.8 fL   TORADOL 60MG  IM ADMINISTERED IN OFFICE. Assessment & Plan:  Flank pain - Plan: POCT UA - Microscopic Only, POCT urinalysis dipstick, POCT CBC, Urine culture, ketorolac (TORADOL) injection 60 mg  Renal calculus or stone - Plan: POCT CBC, Urine culture, ketorolac (TORADOL) injection 60 mg  Hematuria - Plan: POCT CBC, Urine culture, ketorolac (TORADOL) injection 60 mg  Nephrolithiasis  1.  Renal colic on R:  Persistent with recent worsening; scheduled for stent placement in five days by Dr. Kathrynn RunningManning of Urology; s/p Toradol injection in office; rx for Percocet 5/325 #40 provided. 2.  Nephrolithiasis:  Persistent; s/p Toradol; obtain Urine culture and BMET.  Continue Flomax.  Follow-up with urology. 3.  R shoulder pain:  Persistent; followed by ortho.    Meds ordered this encounter  Medications  . ketorolac (TORADOL) injection 60 mg    Sig:   . oxyCODONE-acetaminophen (PERCOCET/ROXICET) 5-325 MG per tablet    Sig: Take 1-2 tablets by mouth every 6 (six) hours as needed for moderate pain or severe pain.    Dispense:  40 tablet    Refill:  0    Order Specific Question:  Supervising Provider    Answer:  Eber HongMILLER, BRIAN D [[id number]]   I personally performed the services described in this documentation, which was scribed in my presence.  The recorded information has been reviewed and is accurate.   Nilda SimmerKristi Santana Gosdin, M.D.  [facility]  [place] [address] [place], [place]  [id number] [phone] phone [phone] fax

## 2013-06-14 NOTE — Patient Instructions (Signed)
1.  Please return for development of fever, vomiting, burning with urination. 2. The Percocet prescribed today should last for the next five days.  Call urology if you need any further pain medications.

## 2013-06-15 LAB — BASIC METABOLIC PANEL
BUN: 12 mg/dL (ref 6–23)
CHLORIDE: 101 meq/L (ref 96–112)
CO2: 26 meq/L (ref 19–32)
CREATININE: 1.08 mg/dL (ref 0.50–1.35)
Calcium: 10.3 mg/dL (ref 8.4–10.5)
Glucose, Bld: 83 mg/dL (ref 70–99)
Potassium: 4.7 mEq/L (ref 3.5–5.3)
Sodium: 138 mEq/L (ref 135–145)

## 2013-06-16 ENCOUNTER — Emergency Department (HOSPITAL_COMMUNITY)
Admission: EM | Admit: 2013-06-16 | Discharge: 2013-06-17 | Disposition: A | Discharge status: Home / Self Care | Payer: 59 | Attending: Emergency Medicine | Admitting: Emergency Medicine

## 2013-06-16 ENCOUNTER — Encounter (HOSPITAL_COMMUNITY): Payer: Self-pay | Admitting: Emergency Medicine

## 2013-06-16 ENCOUNTER — Other Ambulatory Visit: Payer: Self-pay

## 2013-06-16 DIAGNOSIS — R109 Unspecified abdominal pain: Secondary | ICD-10-CM | POA: Diagnosis present

## 2013-06-16 DIAGNOSIS — N23 Unspecified renal colic: Secondary | ICD-10-CM | POA: Diagnosis not present

## 2013-06-16 DIAGNOSIS — R110 Nausea: Secondary | ICD-10-CM | POA: Insufficient documentation

## 2013-06-16 DIAGNOSIS — Z87442 Personal history of urinary calculi: Secondary | ICD-10-CM | POA: Insufficient documentation

## 2013-06-16 LAB — CBC WITH DIFFERENTIAL/PLATELET
Basophils Absolute: 0 10*3/uL (ref 0.0–0.1)
Basophils Relative: 0 % (ref 0–1)
Eosinophils Absolute: 0 10*3/uL (ref 0.0–0.7)
Eosinophils Relative: 0 % (ref 0–5)
HCT: 42.9 % (ref 39.0–52.0)
HEMOGLOBIN: 15 g/dL (ref 13.0–17.0)
LYMPHS ABS: 0.7 10*3/uL (ref 0.7–4.0)
LYMPHS PCT: 10 % — AB (ref 12–46)
MCH: 30.9 pg (ref 26.0–34.0)
MCHC: 35 g/dL (ref 30.0–36.0)
MCV: 88.5 fL (ref 78.0–100.0)
MONOS PCT: 8 % (ref 3–12)
Monocytes Absolute: 0.6 10*3/uL (ref 0.1–1.0)
NEUTROS ABS: 6.4 10*3/uL (ref 1.7–7.7)
NEUTROS PCT: 82 % — AB (ref 43–77)
Platelets: 295 10*3/uL (ref 150–400)
RBC: 4.85 MIL/uL (ref 4.22–5.81)
RDW: 13.2 % (ref 11.5–15.5)
WBC: 7.7 10*3/uL (ref 4.0–10.5)

## 2013-06-16 LAB — URINALYSIS, ROUTINE W REFLEX MICROSCOPIC
BILIRUBIN URINE: NEGATIVE
Glucose, UA: NEGATIVE mg/dL
Ketones, ur: NEGATIVE mg/dL
Leukocytes, UA: NEGATIVE
Nitrite: NEGATIVE
Protein, ur: NEGATIVE mg/dL
SPECIFIC GRAVITY, URINE: 1.017 (ref 1.005–1.030)
Urobilinogen, UA: 0.2 mg/dL (ref 0.0–1.0)
pH: 7 (ref 5.0–8.0)

## 2013-06-16 LAB — POCT I-STAT, CHEM 8
BUN: 9 mg/dL (ref 6–23)
CREATININE: 1.2 mg/dL (ref 0.50–1.35)
Calcium, Ion: 1.16 mmol/L (ref 1.12–1.23)
Chloride: 101 mEq/L (ref 96–112)
GLUCOSE: 94 mg/dL (ref 70–99)
HCT: 48 % (ref 39.0–52.0)
Hemoglobin: 16.3 g/dL (ref 13.0–17.0)
POTASSIUM: 3.6 meq/L — AB (ref 3.7–5.3)
Sodium: 139 mEq/L (ref 137–147)
TCO2: 25 mmol/L (ref 0–100)

## 2013-06-16 LAB — URINE MICROSCOPIC-ADD ON

## 2013-06-16 LAB — URINE CULTURE
Colony Count: NO GROWTH
Organism ID, Bacteria: NO GROWTH

## 2013-06-16 MED ORDER — KETOROLAC TROMETHAMINE 30 MG/ML IJ SOLN
30.0000 mg | Freq: Once | INTRAMUSCULAR | Status: AC
  Administered 2013-06-16: 30 mg via INTRAVENOUS
  Filled 2013-06-16: qty 1

## 2013-06-16 MED ORDER — HYDROMORPHONE HCL PF 1 MG/ML IJ SOLN
1.0000 mg | Freq: Once | INTRAMUSCULAR | Status: AC
  Administered 2013-06-16: 1 mg via INTRAVENOUS
  Filled 2013-06-16: qty 1

## 2013-06-16 MED ORDER — OXYCODONE-ACETAMINOPHEN 5-325 MG PO TABS: 1.0000 | ORAL_TABLET | ORAL | Status: AC | PRN

## 2013-06-16 MED ORDER — OXYCODONE-ACETAMINOPHEN 5-325 MG PO TABS
1.0000 | ORAL_TABLET | Freq: Once | ORAL | Status: AC
  Administered 2013-06-16: 1 via ORAL
  Filled 2013-06-16: qty 1

## 2013-06-16 MED ORDER — ONDANSETRON HCL 4 MG/2ML IJ SOLN
4.0000 mg | Freq: Once | INTRAMUSCULAR | Status: AC
  Administered 2013-06-16: 4 mg via INTRAVENOUS
  Filled 2013-06-16: qty 2

## 2013-06-16 NOTE — Discharge Instructions (Signed)
Continue your pain medications at home. Please follow up with Dr. Berneice HeinrichManny, call him tomorrow if not improving. Report to [facility] if worsening or develop any fever, uncontrolled pain.    Kidney Stones Kidney stones (urolithiasis) are deposits that form inside your kidneys. The intense pain is caused by the stone moving through the urinary tract. When the stone moves, the ureter goes into spasm around the stone. The stone is usually passed in the urine.  CAUSES   A disorder that makes certain neck glands produce too much parathyroid hormone (primary hyperparathyroidism).  A buildup of uric acid crystals, similar to gout in your joints.  Narrowing (stricture) of the ureter.  A kidney obstruction present at birth (congenital obstruction).  Previous surgery on the kidney or ureters.  Numerous kidney infections. SYMPTOMS   Feeling sick to your stomach (nauseous).  Throwing up (vomiting).  Blood in the urine (hematuria).  Pain that usually spreads (radiates) to the groin.  Frequency or urgency of urination. DIAGNOSIS   Taking a history and physical exam.  Blood or urine tests.  CT scan.  Occasionally, an examination of the inside of the urinary bladder (cystoscopy) is performed. TREATMENT   Observation.  Increasing your fluid intake.  Extracorporeal shock wave lithotripsy This is a noninvasive procedure that uses shock waves to break up kidney stones.  Surgery may be needed if you have severe pain or persistent obstruction. There are various surgical procedures. Most of the procedures are performed with the use of small instruments. Only small incisions are needed to accommodate these instruments, so recovery time is minimized. The size, location, and chemical composition are all important variables that will determine the proper choice of action for you. Talk to your health care provider to better understand your situation so that you will minimize the risk of injury to  yourself and your kidney.  HOME CARE INSTRUCTIONS   Drink enough water and fluids to keep your urine clear or pale yellow. This will help you to pass the stone or stone fragments.  Strain all urine through the provided strainer. Keep all particulate matter and stones for your health care provider to see. The stone causing the pain may be as small as a grain of salt. It is very important to use the strainer each and every time you pass your urine. The collection of your stone will allow your health care provider to analyze it and verify that a stone has actually passed. The stone analysis will often identify what you can do to reduce the incidence of recurrences.  Only take over-the-counter or prescription medicines for pain, discomfort, or fever as directed by your health care provider.  Make a follow-up appointment with your health care provider as directed.  Get follow-up X-rays if required. The absence of pain does not always mean that the stone has passed. It may have only stopped moving. If the urine remains completely obstructed, it can cause loss of kidney function or even complete destruction of the kidney. It is your responsibility to make sure X-rays and follow-ups are completed. Ultrasounds of the kidney can show blockages and the status of the kidney. Ultrasounds are not associated with any radiation and can be performed easily in a matter of minutes. SEEK MEDICAL CARE IF:  You experience pain that is progressive and unresponsive to any pain medicine you have been prescribed. SEEK IMMEDIATE MEDICAL CARE IF:   Pain cannot be controlled with the prescribed medicine.  You have a fever or shaking  chills.  The severity or intensity of pain increases over 18 hours and is not relieved by pain medicine.  You develop a new onset of abdominal pain.  You feel faint or pass out.  You are unable to urinate. MAKE SURE YOU:   Understand these instructions.  Will watch your  condition.  Will get help right away if you are not doing well or get worse. Document Released: 04/30/2005 Document Revised: 12/31/2012 Document Reviewed: 10/01/2012 [facility] Patient Information 2014 [place].

## 2013-06-16 NOTE — ED Notes (Signed)
Pt. reports right flank pain with hematuria onset this morning , denies dysuria or fever , pt. stated history of kidney stones.

## 2013-06-16 NOTE — ED Provider Notes (Signed)
CSN: [id number]     Arrival date & time 06/16/13  1912 History   First MD Initiated Contact with Patient 06/16/13 2010     Chief Complaint  Patient presents with  . Nephrolithiasis   (Consider location/radiation/quality/duration/timing/severity/associated sxs/prior Treatment) HPI Elsworth Ledin is a 38 y.o. male who presents emergency department complaining of right flank pain. He states he just passed a kidney stone a few days ago and has been doing well when he woke up this morning with pain in the right flank. He states his pain is similar to his prior kidney stones. He states he has had multiple kidney stones in the last 2 months. He is followed by Dr. Urban Gibson urology who is planning on placing renal stents in 2 days. Patient denies any fever. He denies any vomiting or changes in bowels. He states he is nauseated. He denies any obvious blood in his urine or any urinary symptoms. He took a Agricultural consultant today which did not help. He denies any current abdominal pain, states pain is just to the flank for now.  Past Medical History  Diagnosis Date  . Kidney stones    Past Surgical History  Procedure Laterality Date  . Lithotripsy    . Stone extraction with basket     No family history on file. History  Substance Use Topics  . Smoking status: Never Smoker   . Smokeless tobacco: Never Used  . Alcohol Use: No    Review of Systems  Constitutional: Negative for fever and chills.  Respiratory: Negative for cough, chest tightness and shortness of breath.   Cardiovascular: Negative for chest pain, palpitations and leg swelling.  Gastrointestinal: Positive for nausea. Negative for vomiting, abdominal pain, diarrhea and abdominal distention.  Genitourinary: Positive for flank pain. Negative for dysuria, urgency, frequency, hematuria, penile swelling, scrotal swelling and testicular pain.  Musculoskeletal: Negative for arthralgias, myalgias, neck pain and neck stiffness.  Skin: Negative for  rash.  Allergic/Immunologic: Negative for immunocompromised state.  Neurological: Negative for dizziness, weakness, light-headedness, numbness and headaches.  All other systems reviewed and are negative.    Allergies  Review of patient's allergies indicates no known allergies.  Home Medications   Current Outpatient Rx  Name  Route  Sig  Dispense  Refill  . ibuprofen (ADVIL,MOTRIN) 200 MG tablet   Oral   Take 200 mg by mouth every 8 (eight) hours as needed for moderate pain.          Marland Kitchen oxyCODONE-acetaminophen (PERCOCET/ROXICET) 5-325 MG per tablet   Oral   Take 1-2 tablets by mouth every 6 (six) hours as needed for moderate pain or severe pain.   40 tablet   0   . tamsulosin (FLOMAX) 0.4 MG CAPS capsule   Oral   Take 0.4 mg by mouth at bedtime.          BP 160/110  Pulse 115  Temp(Src) 98.6 F (37 C) (Oral)  Resp 22  Ht 5\' 9"  (1.753 m)  Wt 193 lb (87.544 kg)  BMI 28.49 kg/m2  SpO2 99% Physical Exam  Nursing note and vitals reviewed. Constitutional: He is oriented to person, place, and time. He appears well-developed and well-nourished. No distress.  HENT:  Head: Normocephalic and atraumatic.  Right Ear: External ear normal.  Left Ear: External ear normal.  Mouth/Throat: Oropharynx is clear and moist.  Eyes: Conjunctivae are normal.  Neck: Normal range of motion. Neck supple.  Cardiovascular: Normal rate, regular rhythm and normal heart sounds.   Pulmonary/Chest:  Effort normal and breath sounds normal. No respiratory distress. He has no wheezes. He has no rales.  Abdominal: Soft. Bowel sounds are normal. There is no tenderness.  No CVA tenderness  Musculoskeletal: He exhibits no edema and no tenderness.  Lymphadenopathy:    He has no cervical adenopathy.  Neurological: He is alert and oriented to person, place, and time.  Skin: Skin is warm and dry. No erythema.  Psychiatric: He has a normal mood and affect.    ED Course  Procedures (including critical  care time) Labs Review Labs Reviewed  URINALYSIS, ROUTINE W REFLEX MICROSCOPIC - Abnormal; Notable for the following:    Hgb urine dipstick LARGE (*)    All other components within normal limits  CBC WITH DIFFERENTIAL - Abnormal; Notable for the following:    Neutrophils Relative % 82 (*)    Lymphocytes Relative 10 (*)    All other components within normal limits  POCT I-STAT, CHEM 8 - Abnormal; Notable for the following:    Potassium 3.6 (*)    All other components within normal limits  URINE MICROSCOPIC-ADD ON   Imaging Review No results found.  EKG Interpretation   None       MDM   1. Renal colic on right side     Patient with history of kidney stones. Prior CTs and ultrasounds reviewed which did show kidney stones. He does have history of lithotripsies and stone extraction with basket. He is followed by Dr. Urban GibsonMani. He does have an appointment set up for Friday for stenting. UA is unremarkable other than large hemoglobin. Renal function is normal. Pain is controlled in emergency department with IV Dilaudid, IV Toradol, and a Percocet by mouth. Patient states his pain is still there but he is feeling better. He wants to go home. His vital signs have improved with pain management. I did not do any imaging today, patient does have a close appointment set up for followup, and I do not think he has any surgical pathology at this time. I have given him a prescription for 15 more Percocet tablets. He states he has he left at home 2. He also has Flomax at home that he'll continue to take. Have instructed him to call Dr. Laruth BouchardMinick tomorrow his pain continues. He is [facility] if he develops fever, persistent vomiting, uncontrolled pain.  Filed Vitals:   06/16/13 2115 06/16/13 2215 06/16/13 2222 06/17/13 0000  BP: 121/69 123/92 153/76 112/79  Pulse: 102 81 72 75  Temp:   97.9 F (36.6 C)   TempSrc:   Oral   Resp: 19 17 18    Height:      Weight:       SpO2: 97% 97% 100% 97%       Myriam Jacobsonatyana A Meghan Warshawsky, PA-C 06/17/13 0034

## 2013-06-18 ENCOUNTER — Emergency Department (HOSPITAL_COMMUNITY): Payer: 59

## 2013-06-18 ENCOUNTER — Encounter (HOSPITAL_COMMUNITY): Payer: Self-pay | Admitting: Emergency Medicine

## 2013-06-18 ENCOUNTER — Ambulatory Visit: Payer: 59 | Admitting: Physical Medicine & Rehabilitation

## 2013-06-18 ENCOUNTER — Emergency Department (HOSPITAL_COMMUNITY)
Admission: EM | Admit: 2013-06-18 | Discharge: 2013-06-18 | Disposition: A | Discharge status: Home / Self Care | Payer: 59 | Attending: Emergency Medicine | Admitting: Emergency Medicine

## 2013-06-18 ENCOUNTER — Encounter: Payer: 59 | Attending: Physical Medicine & Rehabilitation

## 2013-06-18 DIAGNOSIS — R109 Unspecified abdominal pain: Secondary | ICD-10-CM

## 2013-06-18 DIAGNOSIS — X58XXXA Exposure to other specified factors, initial encounter: Secondary | ICD-10-CM | POA: Insufficient documentation

## 2013-06-18 DIAGNOSIS — S43429A Sprain of unspecified rotator cuff capsule, initial encounter: Secondary | ICD-10-CM | POA: Insufficient documentation

## 2013-06-18 DIAGNOSIS — R110 Nausea: Secondary | ICD-10-CM | POA: Insufficient documentation

## 2013-06-18 DIAGNOSIS — Z87442 Personal history of urinary calculi: Secondary | ICD-10-CM | POA: Insufficient documentation

## 2013-06-18 DIAGNOSIS — M25519 Pain in unspecified shoulder: Secondary | ICD-10-CM | POA: Insufficient documentation

## 2013-06-18 LAB — URINALYSIS W MICROSCOPIC + REFLEX CULTURE
BILIRUBIN URINE: NEGATIVE
Glucose, UA: NEGATIVE mg/dL
Ketones, ur: NEGATIVE mg/dL
Leukocytes, UA: NEGATIVE
Nitrite: NEGATIVE
PH: 6.5 (ref 5.0–8.0)
Protein, ur: NEGATIVE mg/dL
SPECIFIC GRAVITY, URINE: 1.021 (ref 1.005–1.030)
Urobilinogen, UA: 0.2 mg/dL (ref 0.0–1.0)

## 2013-06-18 MED ORDER — ONDANSETRON 8 MG PO TBDP
8.0000 mg | ORAL_TABLET | Freq: Once | ORAL | Status: AC
  Administered 2013-06-18: 8 mg via ORAL
  Filled 2013-06-18: qty 1

## 2013-06-18 MED ORDER — KETOROLAC TROMETHAMINE 60 MG/2ML IM SOLN
60.0000 mg | Freq: Once | INTRAMUSCULAR | Status: AC
  Administered 2013-06-18: 60 mg via INTRAMUSCULAR
  Filled 2013-06-18: qty 2

## 2013-06-18 MED ORDER — HYDROMORPHONE HCL PF 2 MG/ML IJ SOLN
2.0000 mg | Freq: Once | INTRAMUSCULAR | Status: AC
  Administered 2013-06-18: 2 mg via INTRAMUSCULAR
  Filled 2013-06-18: qty 1

## 2013-06-18 MED ORDER — METHOCARBAMOL 500 MG PO TABS: 1000.0000 mg | ORAL_TABLET | Freq: Four times a day (QID) | ORAL | Status: AC | PRN

## 2013-06-18 NOTE — ED Notes (Signed)
Pt states he has right flank pain that started on Tuesday  Pt went to the ed on Tuesday and was told to come here if he got worse  Pt states pain is not controlled by po pain meds at home  Pt states he has kidney stones in the past  Pain is the same

## 2013-06-18 NOTE — ED Provider Notes (Signed)
CSN: [id number]     Arrival date & time 06/18/13  1929 History   First MD Initiated Contact with Patient 06/18/13 2018     Chief Complaint  Patient presents with  . Flank Pain    HPI Pt was seen at 2050. Per pt, c/o sudden onset and persistence of waxing and waning right sided flank "pain" that began 2 days ago.  Pt describes the pain as "like my last kidney stone," and radiating into the right side of his abd.  Has been associated with nausea.  Denies testicular pain/swelling, no dysuria/hematuria, no abd pain, no vomiting/diarrhea, no CP/SOB. The symptoms have been associated with no other complaints. The patient has a significant history of similar symptoms previously, recently being evaluated for this complaint and multiple prior evals for same. Pt has been evaluated in the ED 15 times in the past 6 months for same, with most recent ED visit 2 days ago.      Uro: In  Past Medical History  Diagnosis Date  . Kidney stones    Past Surgical History  Procedure Laterality Date  . Lithotripsy    . Stone extraction with basket      History  Substance Use Topics  . Smoking status: Never Smoker   . Smokeless tobacco: Never Used  . Alcohol Use: No    Review of Systems ROS: Statement: All systems negative except as marked or noted in the HPI; Constitutional: Negative for fever and chills. ; ; Eyes: Negative for eye pain, redness and discharge. ; ; ENMT: Negative for ear pain, hoarseness, nasal congestion, sinus pressure and sore throat. ; ; Cardiovascular: Negative for chest pain, palpitations, diaphoresis, dyspnea and peripheral edema. ; ; Respiratory: Negative for cough, wheezing and stridor. ; ; Gastrointestinal: +nausea. Negative for vomiting, diarrhea, abdominal pain, blood in stool, hematemesis, jaundice and rectal bleeding. . ; ; Genitourinary: +flank pain. Negative for dysuria and hematuria. ; ; Genital:  No penile drainage or rash, no testicular pain or swelling, no scrotal  rash or swelling.;; Musculoskeletal: Negative for back pain and neck pain. Negative for swelling and trauma.; ; Skin: Negative for pruritus, rash, abrasions, blisters, bruising and skin lesion.; ; Neuro: Negative for headache, lightheadedness and neck stiffness. Negative for weakness, altered level of consciousness , altered mental status, extremity weakness, paresthesias, involuntary movement, seizure and syncope.      Allergies  Review of patient's allergies indicates no known allergies.  Home Medications   Current Outpatient Rx  Name  Route  Sig  Dispense  Refill  . ibuprofen (ADVIL,MOTRIN) 200 MG tablet   Oral   Take 600 mg by mouth every 8 (eight) hours as needed for moderate pain (kidney stones).          Marland Kitchen oxyCODONE-acetaminophen (PERCOCET/ROXICET) 5-325 MG per tablet   Oral   Take 1-2 tablets by mouth every 4 (four) hours as needed for severe pain.   15 tablet   0   . tamsulosin (FLOMAX) 0.4 MG CAPS capsule   Oral   Take 0.4 mg by mouth at bedtime.          BP 134/94  Pulse 102  Temp(Src) 98.6 F (37 C) (Oral)  Resp 22  SpO2 100% Physical Exam 2055: Physical examination:  Nursing notes reviewed; Vital signs and O2 SAT reviewed;  Constitutional: Well developed, Well nourished, Well hydrated, Uncomfortable appearing.; Head:  Normocephalic, atraumatic; Eyes: EOMI, PERRL, No scleral icterus; ENMT: TM's clear bilat. +edemetous nasal turbinates bilat with clear rhinorrhea. Mouth  and pharynx normal, Mucous membranes moist; Neck: Supple, Full range of motion, No lymphadenopathy; Cardiovascular: Regular rate and rhythm, No murmur, rub, or gallop; Respiratory: Breath sounds clear & equal bilaterally, No rales, rhonchi, wheezes.  Speaking full sentences with ease, Normal respiratory effort/excursion; Chest: Nontender, Movement normal; Abdomen: Soft, Nontender, Nondistended, Normal bowel sounds; Genitourinary: No CVA tenderness; Spine:  No midline CS, TS, LS tenderness.;;  Extremities: Pulses normal, No tenderness, No edema, No calf edema or asymmetry.; Neuro: AA&Ox3, Major CN grossly intact.  Speech clear. No gross focal motor or sensory deficits in extremities.; Skin: Color normal, Warm, Dry.   ED Course  Procedures   EKG Interpretation   None       MDM  MDM Reviewed: previous chart, nursing note and vitals Reviewed previous: labs, ultrasound and CT scan Interpretation: labs and CT scan   Results for orders placed during the hospital encounter of 06/18/13  URINALYSIS W MICROSCOPIC + REFLEX CULTURE      Result Value Range   Color, Urine YELLOW  YELLOW   APPearance CLEAR  CLEAR   Specific Gravity, Urine 1.021  1.005 - 1.030   pH 6.5  5.0 - 8.0   Glucose, UA NEGATIVE  NEGATIVE mg/dL   Hgb urine dipstick LARGE (*) NEGATIVE   Bilirubin Urine NEGATIVE  NEGATIVE   Ketones, ur NEGATIVE  NEGATIVE mg/dL   Protein, ur NEGATIVE  NEGATIVE mg/dL   Urobilinogen, UA 0.2  0.0 - 1.0 mg/dL   Nitrite NEGATIVE  NEGATIVE   Leukocytes, UA NEGATIVE  NEGATIVE   WBC, UA 0-2  <3 WBC/hpf   RBC / HPF 21-50  <3 RBC/hpf   Bacteria, UA RARE  RARE   Ct Abdomen Pelvis Wo Contrast 06/18/2013   CLINICAL DATA:  Right flank pain  EXAM: CT ABDOMEN AND PELVIS WITHOUT CONTRAST  TECHNIQUE: Multidetector CT imaging of the abdomen and pelvis was performed following the standard protocol without intravenous contrast.  COMPARISON:  None.  FINDINGS: Lung bases are clear.  No pericardial fluid.  No focal hepatic lesion. Gallbladder, pancreas, spleen, adrenal glands are normal.  The bilateral punctate renal calculi which are nonobstructed. These stones range and size from 1 at 3 mm and approximately 6 in each kidney. No ureterolithiasis or hydroureter on the left or right.  The stomach, small bowel, appendix, cecum are normal. The colon and rectosigmoid colon are normal.  Abdominal aorta is normal caliber. No retroperitoneal periportal lymphadenopathy.  No free fluid the pelvis. No distal  ureteral stones or bladder stones. Prostate gland is normal. No aggressive osseous lesion.  IMPRESSION: 1. Bilateral small nephrolithiasis. 2. No ureterolithiasis or obstructive uropathy. 3. Normal appendix. 4. No change from prior.   Electronically Signed   By: Genevive BiStewart  Edmunds M.D.   On: 06/18/2013 21:46   [place] Renal 06/11/2013   CLINICAL DATA:  Flank pain (right side)  EXAM: RENAL/URINARY TRACT ULTRASOUND COMPLETE  COMPARISON:  05/15/2013 ultrasound, 04/24/2013 CT.  FINDINGS: Right Kidney:  Length: Ram 11.3 cm. Echogenicity within normal limits. No mass or hydronephrosis visualized.  Left Kidney:  Length: 11.3 cm. Echogenicity within normal limits. No mass or hydronephrosis visualized.  Bladder:  Decompressed.  IMPRESSION: Renal ultrasound within normal limits. No hydronephrosis. The patient's known tiny stones are not visualized on today's study (likely technical though interval passage of stone not excluded).   Electronically Signed   By: Jearld LeschAndrew  DelGaizo M.D.   On: 06/11/2013 05:59    2220:  No UTI on Udip. No ureteral calculi seen on CT  scan today. When told, pt stated he "feels better now" and wants to go home. States he is "going back to [place]" this weekend and has an appt with his Uro MD there on Monday (in 4 days).  Strongly encouraged to keep that appt; verb understanding. Dx and testing d/w pt.  Questions answered.  Verb understanding, agreeable to d/c home with outpt f/u.       Laray Anger, DO 06/19/13 1521

## 2013-06-18 NOTE — Discharge Instructions (Signed)
°Emergency Department Resource Guide °1) Find a Doctor and Pay Out of Pocket °Although you won't have to find out who is covered by your insurance plan, it is a good idea to ask around and get recommendations. You will then need to call the office and see if the doctor you have chosen will accept you as a new patient and what types of options they offer for patients who are self-pay. Some doctors offer discounts or will set up payment plans for their patients who do not have insurance, but you will need to ask so you aren't surprised when you get to your appointment. ° °2) Contact Your Local Health Department °Not all health departments have doctors that can see patients for sick visits, but many do, so it is worth a call to see if yours does. If you don't know where your local health department is, you can check in your phone book. The CDC also has a tool to help you locate your state's health department, and many state websites also have listings of all of their local health departments. ° °3) Find a Walk-in Clinic °If your illness is not likely to be very severe or complicated, you may want to try a walk in clinic. These are popping up all over the country in pharmacies, drugstores, and shopping centers. They're usually staffed by nurse practitioners or physician assistants that have been trained to treat common illnesses and complaints. They're usually fairly quick and inexpensive. However, if you have serious medical issues or chronic medical problems, these are probably not your best option. ° °No Primary Care Doctor: °- Call [facility] at  832-8000 - they can help you locate a primary care doctor that  accepts your insurance, provides certain services, etc. °- Physician Referral Service- 1-800-533-3463 ° °Chronic Pain Problems: °Organization         Address  Phone   Notes  °[facility]  (336) 297-2271 Patients need to be referred by their primary care doctor.  ° °Medication  Assistance: °Organization         Address  Phone   Notes  °[place] [facility] [address] °[place], [place] [address] (336) 641-8030 --Must be a resident of [place] °-- Must have NO insurance coverage whatsoever (no Medicaid/ Medicare, etc.) °-- The pt. MUST have a primary care doctor that directs their care regularly and follows them in the community °  °[facility]  (866) 331-1348   °[employer]  (888) 892-1162   ° °Agencies that provide inexpensive medical care: °Organization         Address  Phone   Notes  °[facility]  (336) 832-8035   °[facility]    (336) 832-7272   °[facility] [address] °[place], [place] [address] (336) 832-4777   °[facility] [address], °[place] (336) 271-4999   °[facility]    (336) 373-0678   °[facility]    (336) 272-1050   °[facility] ° [address], [place] Phone:  (336) 832-4444, Fax:  (336) 832-4440 Hours of Operation:  9 am - 6 pm, M-F.  Also accepts Medicaid/Medicare and self-pay.  °[facility] ° [address], [place] Phone: (336) 832-3150, Fax: (336) [phone]. Hours of Operation:  8:30 am - 5:30 pm, M-F.  Also accepts Medicaid and self-pay.  °[facility] [place] [address], [place] Phone: (336) 878-6027   °[facility] [address], [place], [place] (336)723-1848, Ext. 123 Mondays & Thursdays: 7-9 AM.  First 15 patients are seen on a first come, first serve basis. °  ° °Medicaid-accepting [place] Providers: ° °Organization         Address  Phone   Notes  °[facility] [address], [place] (336) 641-2100 Also accepts self-pay patients.  °[facility] [address], [place] ° (336) 856-9996   °[facility] [address], [place]  (336) 288-8857   °[facility] [address] [place] [address], [place] (336) 299-7000   °Veita Bland [address], [place]  ° (336) 373-1557 Only accepts [place] Access Medicaid patients after they have their name applied to their card.  ° °Self-Pay (no insurance) in [place]: ° °Organization         Address  Phone   Notes  °Sickle Cell Patients, [facility] [address], [place] (336) 832-1970   °[facility] [address], [place] (336) 832-4400   °[facility] ° [address], [place] (336) 992-4800   °[facility]/Dr. Osei-Bonsu ° [address] [place] [address], [place] or [address], [place] (336) 841-8500 Phone number for both [place] and [place] locations is the same.  °[facility] [address], [place] (336) 299-0000   °[facility] [address] [place] [address], [place] or [address] (336) 852-7530 °(336) 878-2260   °[facility] [address], [place] (336) 350-1642, phone; (336) 294-5005, fax Sees patients 1st and 3rd Saturday of every month.  Must not qualify for public or private insurance (i.e. Medicaid, Medicare, [place] Health Choice, Veterans' Benefits) • Household income should be no more than 200% of the poverty level •The clinic cannot treat you if you are pregnant or think you are pregnant • Sexually transmitted diseases are not treated at the clinic.  ° ° °Dental Care: °Organization         Address  Phone  Notes  °[place] [facility] [address], [place] (336) 641-6152 Accepts children up to age 21 who are enrolled in Medicaid or [place] Health Choice; pregnant women with a Medicaid card; and children who have applied for Medicaid or [place] Health Choice, but were declined, whose parents can pay a reduced fee at time of service.  °[place]  [facility] [place]  [address], [place] (336) 641-7733 Accepts children up to age 21 who are enrolled in Medicaid or [place] Health Choice; pregnant women with a Medicaid card; and children who have applied for Medicaid or [place] Health Choice, but were declined, whose parents can pay a reduced fee at time of service.  °[facility] ° [address], [place] (336) 641-4533 Patients are seen by appointment only. Walk-ins are not accepted. [facility] will see patients 18 years of age and older. °Monday - Tuesday (8am-5pm) °Most Wednesdays (8:30-5pm) °$30 per visit, cash only  °[facility] ° [address], [place] (336) 641-4533 Patients are seen by appointment only. Walk-ins are not accepted. [facility] will see patients 18 years of age and older. °One   Wednesday Evening (Monthly: Volunteer Based).  $30 per visit, cash only  °[facility]  (919) 537-3737 for adults; Children under age 4, call Graduate Pediatric Dentistry at ([phone]) [phone]. Children aged 4-14, please call ([phone]) [phone] to request a pediatric application. ° Dental services are provided in all areas of dental care including fillings, crowns and bridges, complete and partial dentures, implants, gum treatment, root canals, and extractions. Preventive care is also provided. Treatment is provided to both adults and children. °Patients are selected via a lottery and there is often a waiting list. °  °[facility] [address], °[place] ° (336) 763-8833 [email or web address] °  °[facility] [address], [place], [place] (336)723-1848, Ext. 123 Second and Fourth Thursday of each month, opens at 6:30 AM; Clinic ends at 9 AM.  Patients are seen on a first-come first-served basis, and a limited number are seen during each clinic.  ° °[facility] ° [address], [place], [place] (336) 723-7904    Eligibility Requirements °You must have lived in [place], [place], or [place] counties for at least the last three months. °  You cannot be eligible for state or federal sponsored healthcare insurance, including Veterans Administration, Medicaid, or Medicare. °  You generally cannot be eligible for healthcare insurance through your employer.  °  How to apply: °Eligibility screenings are held every Tuesday and Wednesday afternoon from 1:00 pm until 4:00 pm. You do not need an appointment for the interview!  °[facility] [address], [place], [place] 336-631-2330   °[facility]  336-342-8273   °[facility]  336-703-3100   °[facility]  336-570-6415   ° °Behavioral Health Resources in the Community: °Intensive Outpatient Programs °Organization         Address  Phone  Notes  °[facility] [address], [place], [place] 336-878-6098   °[facility] [address], [place], [place] 336-832-9800   °[facility] [address], [place], [place] ° 336-882-2125   °[facility] [address],  °[place], [place] 1-800-853-5163 or 336-641-4981   °Substance Abuse Resources °Organization         Address  Phone  Notes  °[facility]  336-882-2125   °[facility]  336-784-9470   °[facility]  336-285-9073   °[facility]  336-845-3988   °Residential & Outpatient Substance Abuse Program  1-800-659-3381   °Psychological Services °Organization         Address  Phone  Notes  °[facility]  336- 832-9600   °[facility]  336- 378-7881   °[facility] [address], [place] 1-800-853-5163 or 336-641-4981   ° °Mobile Crisis Teams °Organization         Address  Phone  Notes  °[facility], Mobile Crisis Care Unit  1-877-626-1772   °Assertive °[facility] ° [address]  [place], [place] 336-834-9664   °Sharon DeEsch [address] °[place] 336-554-5454   ° °Self-Help/Support Groups °Organization         Address  Phone             Notes  °[facility]  - variety of support groups  336- 373-1402 Call for more information  °Narcotics Anonymous (NA), [facility] [address], °[place]  2 meetings at this location  ° °  Residential Treatment Programs Organization         Address  Phone  Notes  [facility] [address],    [place]  [phone]   [facility]  [address], [place] [phone], [place], [place] [phone]   [facility] [address], [place] [phone] Admissions: 8am-3pm M-F  [facility] [address]    [place], [place] [phone]   [facility] [address], [place], [place] [phone]   [facility] [address]  [place], [place] [phone]   [facility] Intensive Outpatient [address], [place], [place] [phone]   [facility] ([facility] [address]  [place], [place] [phone] or [phone]   [facility] ([facility]) [address] [place], [place] [phone] Accepts Medicaid  [facility] [address]  [place] [phone] Substance Abuse/Addiction Treatment   [facility] Organization         Address  Phone  Notes  [facility]  704-658-4101(888) 234-855-2701   Angie FavaJulie Brannon, PhD [address] [place], [place]   ([phone](336) [phone] or [phone](336) ([phone]   [facility]    [address] [place], [place] [phone](336) ([phone]   [facility] [address], [place], [place] [phone](336) (340)[phone] Insurance/Medicaid/sponsorship through [facility] [address]                                    [place], [place] [phone](336) ([phone] Therapy/tele-psych/case    [facility] [address]   [place], [place] [phone](336) [phone]    Dr. Lolly MustacheArfeen  [phone](336) [phone]   [facility] [facility]  [employer] [facility] 1) [address],  2) [address], [place] 3)  [address], [place] [phone](336) [phone](336) [phone]  [phone](336) ([phone]   [facility] Child Abuse Hot[facility] (519) 817-3981(336) 203-613-7924 or 929-724-7338(336) [phone] (After Hours)       Take the prescription as directed.  Apply moist heat or ice to the area(s) of discomfort, for 15 minutes at a time, several times per day for the next few days.  Do not fall asleep on a heating or ice pack.  Call your regular medical doctor and the Urologist tomorrow to schedule a follow up appointment in the next 3 days.  Return to the Emergency Department immediately if worsening.

## 2013-06-19 NOTE — ED Provider Notes (Signed)
Medical screening examination/treatment/procedure(s) were performed by non-physician practitioner and as supervising physician I was immediately available for consultation/collaboration.  EKG Interpretation   None         Shanna CiscoMegan E Docherty, MD 06/19/13 1606

## 2013-06-26 NOTE — Progress Notes (Signed)
No discharged and this is a new patient, this is simply non narcotic

## 2015-10-11 IMAGING — CR DG SHOULDER 2+V*R*
3 series · 3 of 3 positions shown · non-contrast
Comparison: None.

CLINICAL DATA: Right shoulder pain.

EXAM:
RIGHT SHOULDER - 2+ VIEW

[AP (1 of 2)]
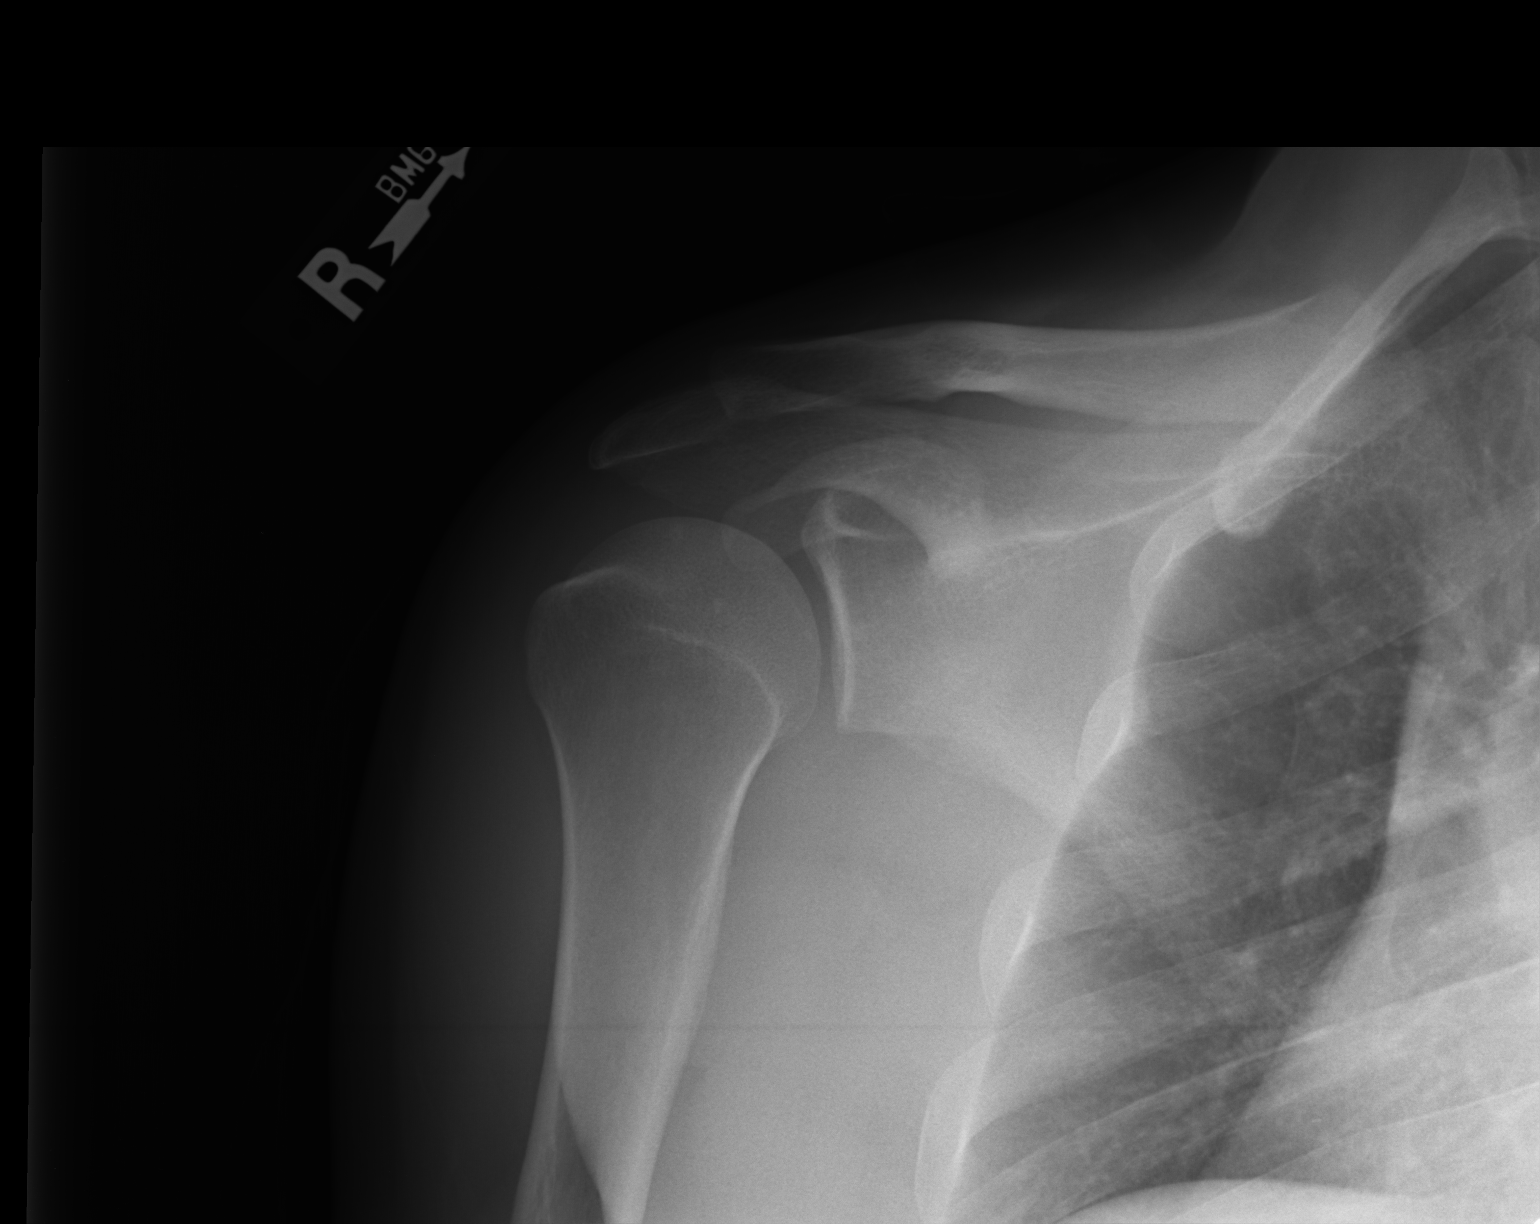

[AP (2 of 2)]
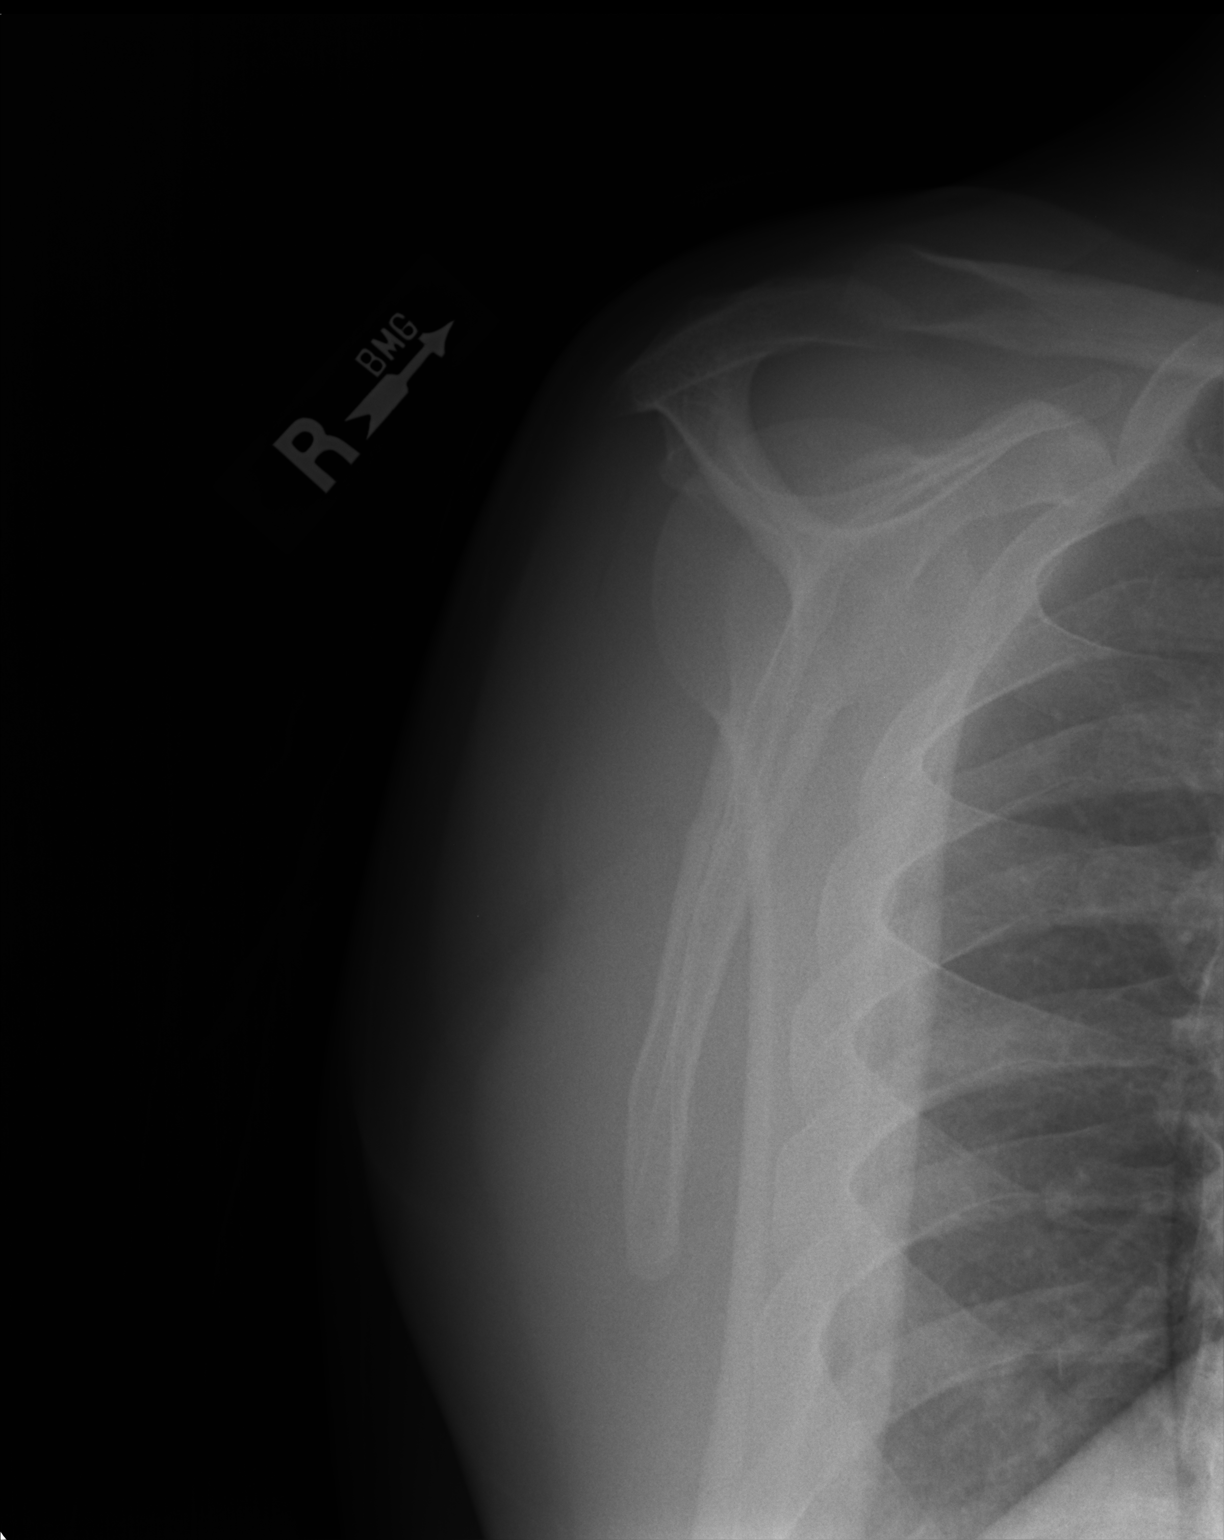

[axial]
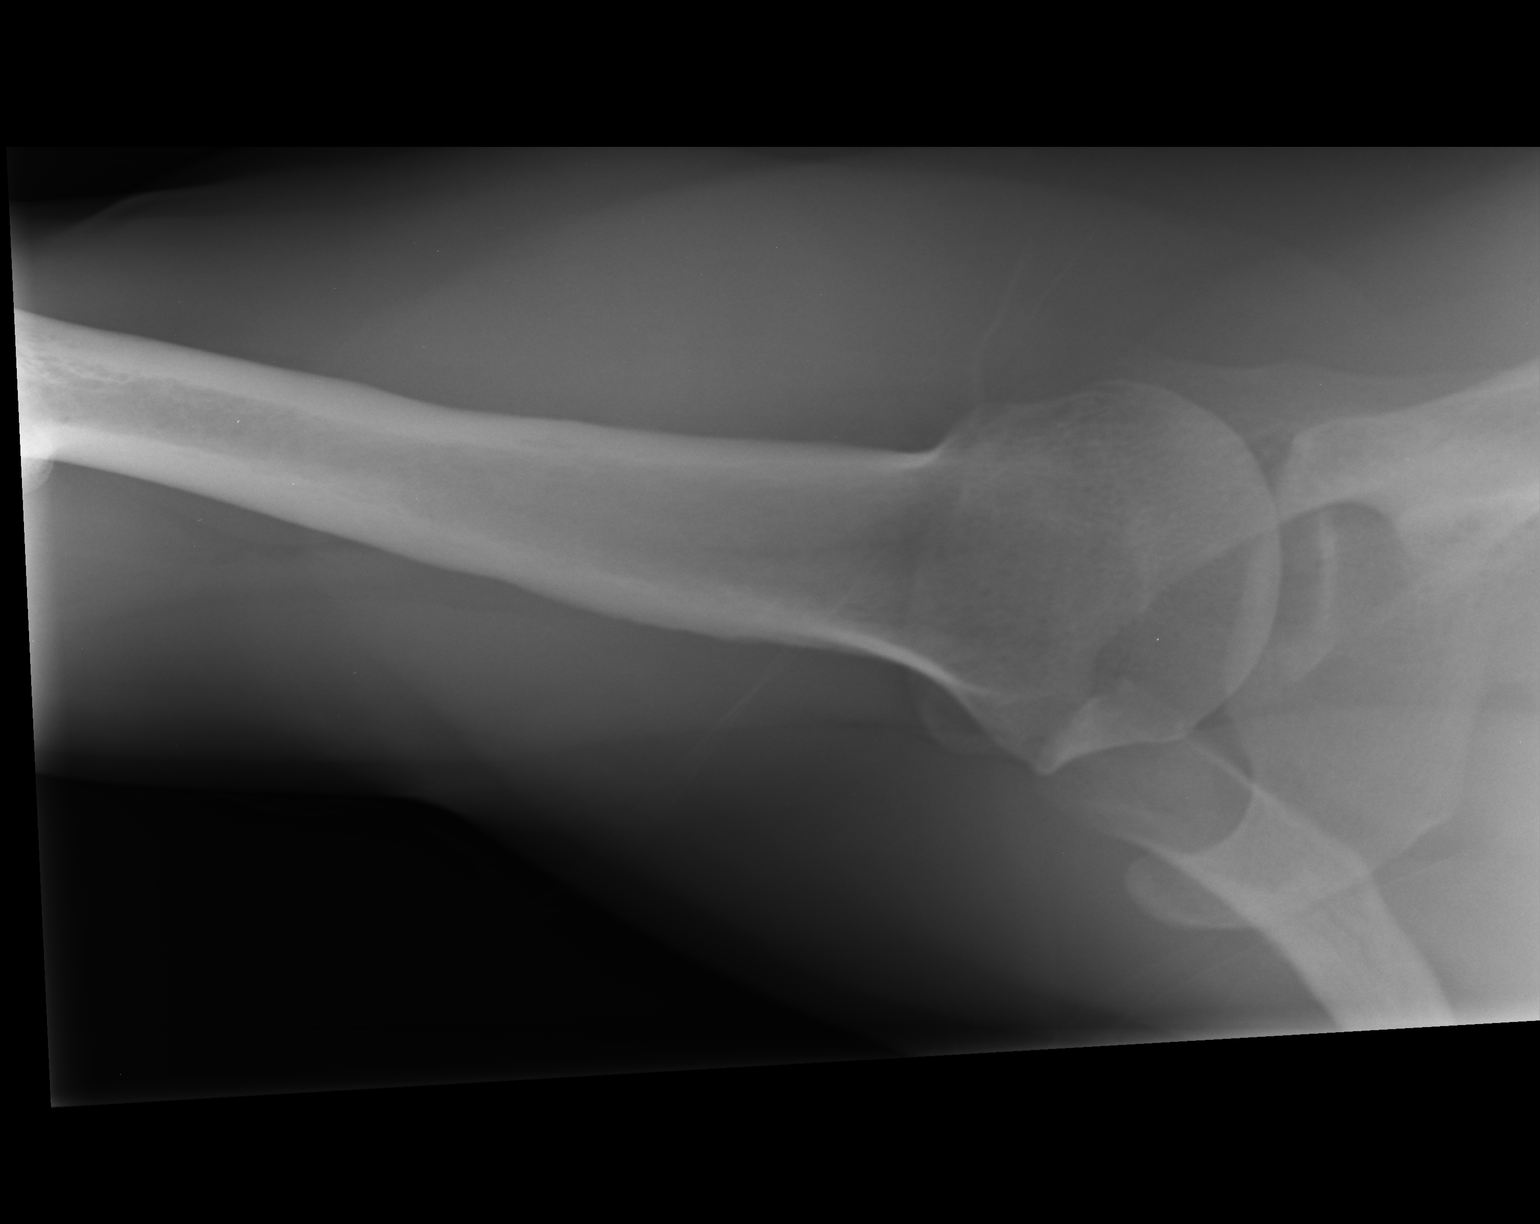

[3 of 3 positions shown; findings below may reference images not displayed]

FINDINGS: There is no evidence of fracture or dislocation. There is no
evidence of arthropathy or other focal bone abnormality. Soft
tissues are unremarkable.
IMPRESSION: Normal exam.

## 2015-10-18 IMAGING — CT CT ABD-PELV W/O CM
1 series · 14 of 18 positions shown, 19 images · non-contrast
Comparison: None.

CLINICAL DATA: Right flank pain, gross hematuria.

EXAM:
CT ABDOMEN AND PELVIS WITHOUT CONTRAST
TECHNIQUE: Multidetector CT imaging of the abdomen and pelvis was performed
following the standard protocol without intravenous contrast.

[Series 3: lung · axial · 0.71mm/px · z∈[+1478,+1553]mm · 14 of 18 slices shown, 19 images]
[im 2/18  soft-tissue]
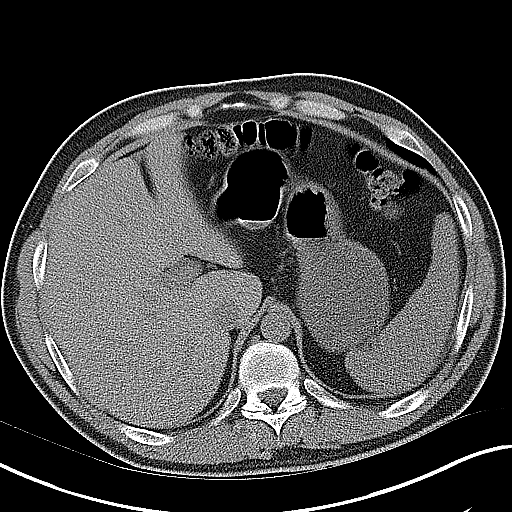
[im 2/18  bone]
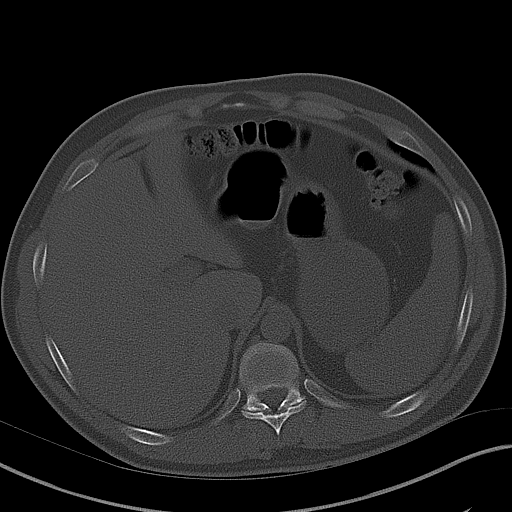
[im 3/18  soft-tissue]
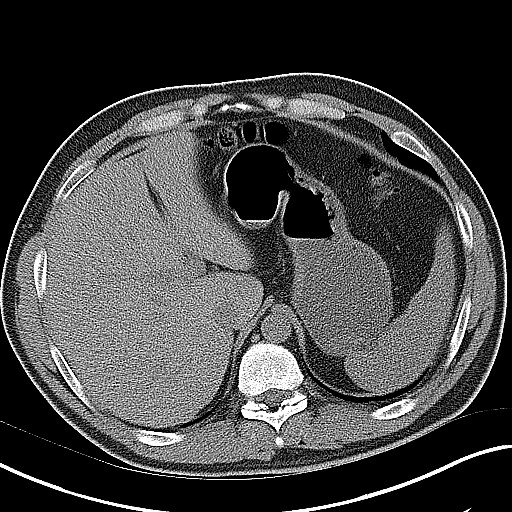
[im 5/18  soft-tissue]
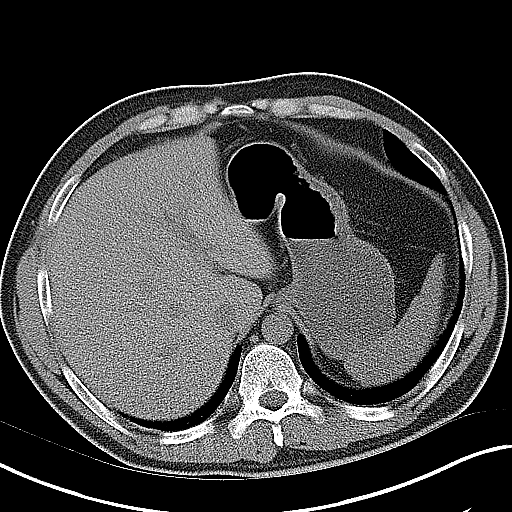
[im 6/18  soft-tissue]
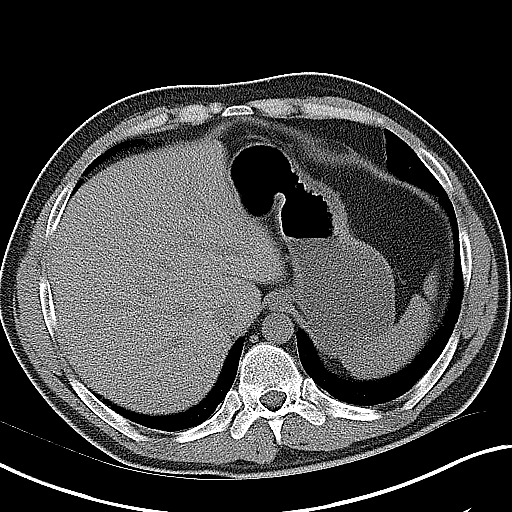
[im 7/18  soft-tissue]
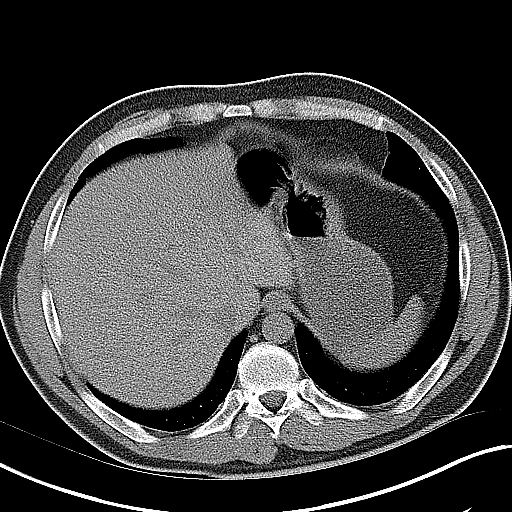
[im 8/18  soft-tissue]
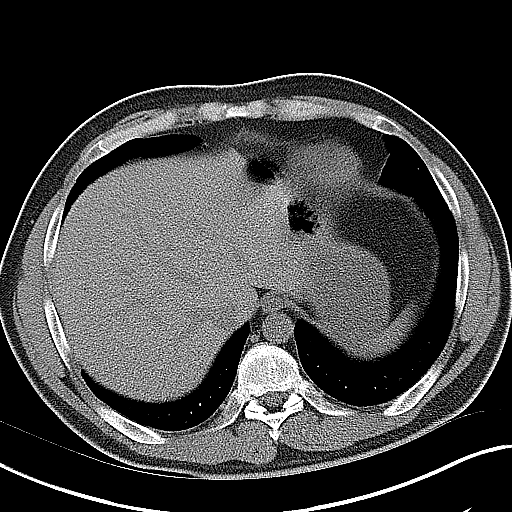
[im 10/18  soft-tissue]
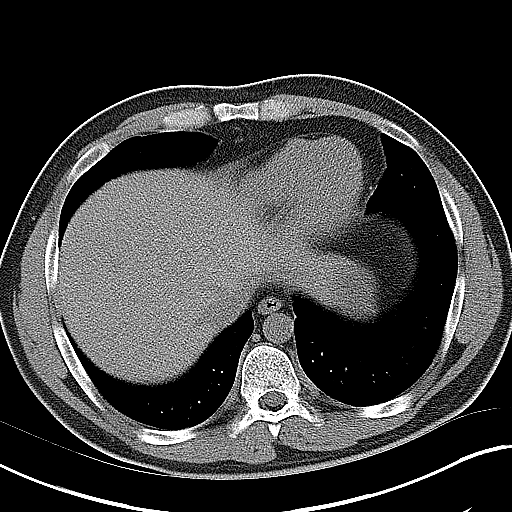
[im 11/18  soft-tissue]
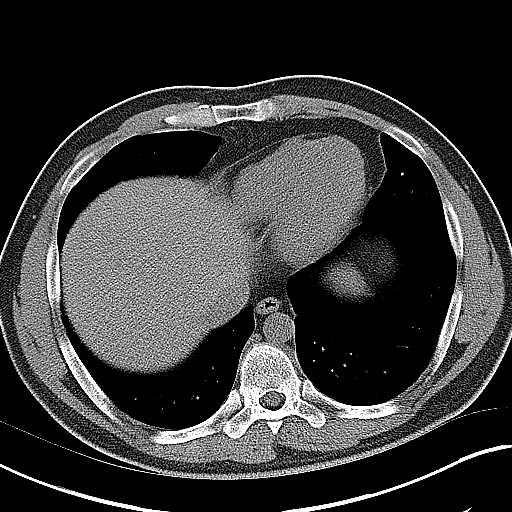
[im 12/18  soft-tissue]
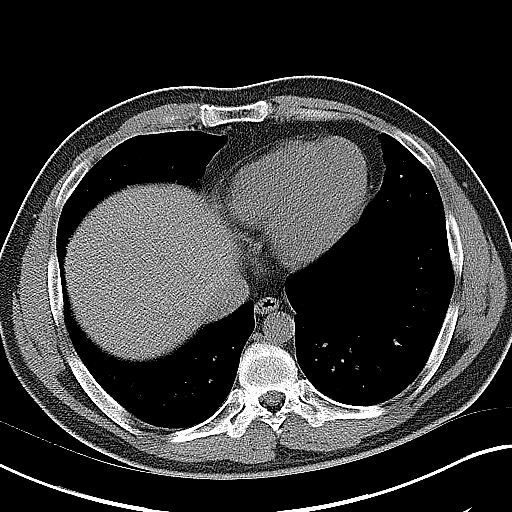
[im 12/18  bone]
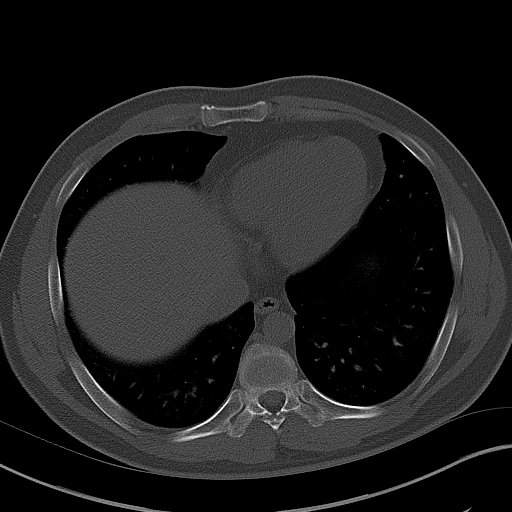
[im 13/18  soft-tissue]
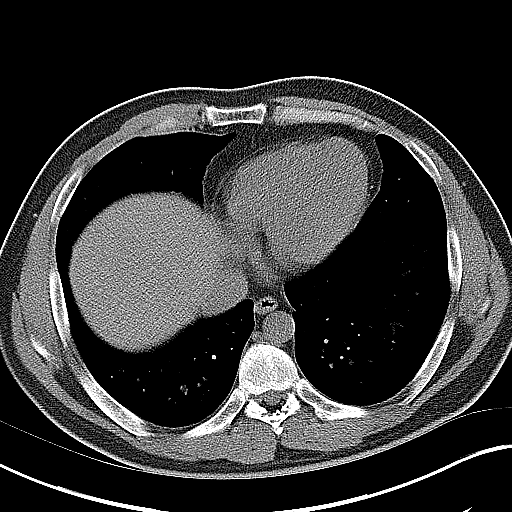
[im 14/18  soft-tissue]
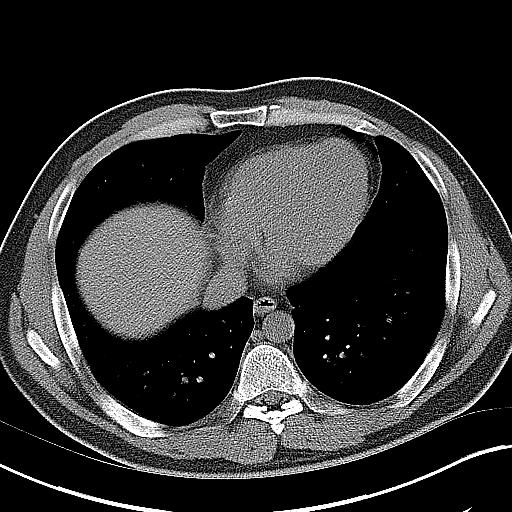
[im 14/18  lung]
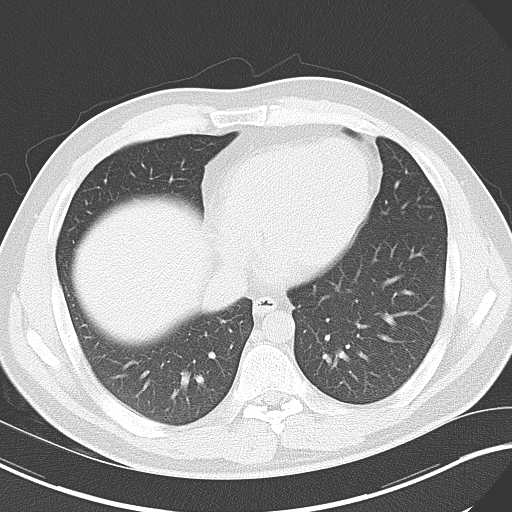
[im 15/18  lung]
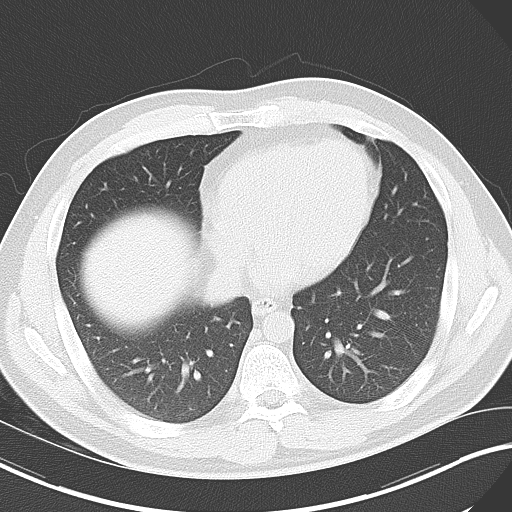
[im 16/18  soft-tissue]
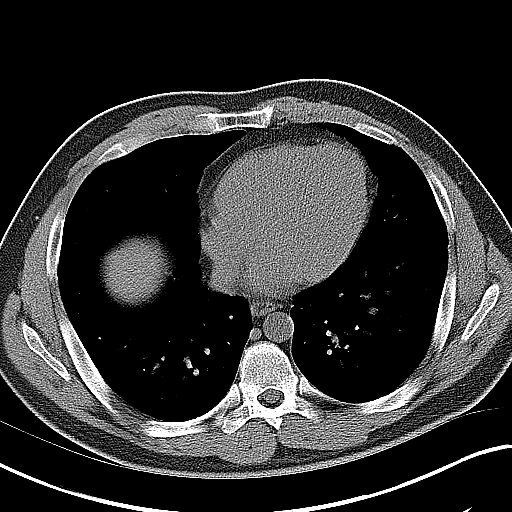
[im 16/18  lung]
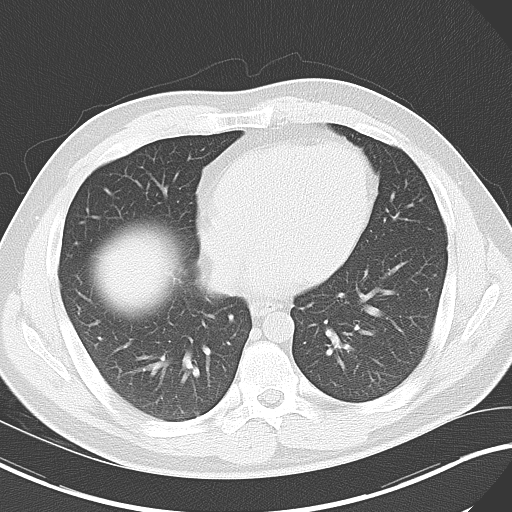
[im 17/18  soft-tissue]
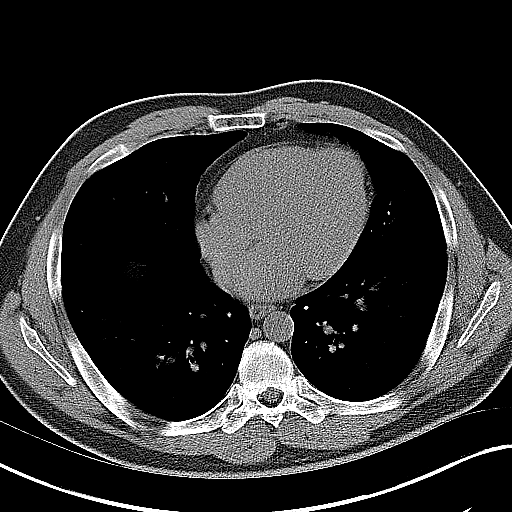
[im 17/18  lung]
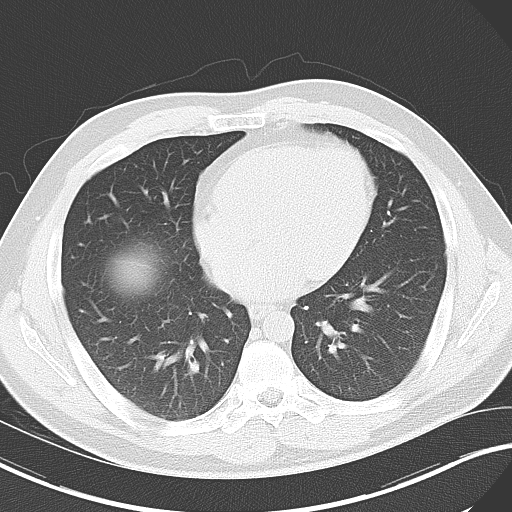

[14 of 18 positions shown; findings below may reference images not displayed]

FINDINGS: 3 mm nodule is noted within the left lower lobe (series 3, image 4).
Lungs are otherwise unremarkable.

Limited noncontrast evaluation liver is unremarkable. Gallbladder is
within normal limits. There is no biliary ductal dilatation.

A few scattered subcentimeter splenic hypodensities are noted, the
largest of which measures 5 mm (series 2, image 19). These are too
small the characterize by CT. Spleen is otherwise unremarkable. The
adrenal glands and pancreas are within normal limits.

Multiple nonobstructive calculi are seen within the left kidney, the
largest of which is located in the lower pole and measures 4 mm. No
obstructive left renal stones identified. There is no left-sided
hydronephrosis or hydroureter.

On the right, a 4 mm obstructive stone is present within the mid
right ureter (series 4, image 41). There is secondary moderate
hydroureteronephrosis proximally. Additional tiny nonobstructive
renal calculi are seen within the right kidney, the largest of which
is located in the lower pole calyx and measures 4 mm. Sign

There is no evidence of bowel obstruction. Appendix is well
visualized in the right lower quadrant and is unremarkable. No
inflammatory changes seen about the bowels.

The bladder is largely decompressed and not well evaluated. Prostate
is normal.

No free air or fluid. No pathologically enlarged intra-abdominal
pelvic lymph nodes.

Osseous structures are within normal limits.
IMPRESSION: 1. 4 mm obstructive stone within the mid right ureter with secondary
moderate hydroureteronephrosis proximally.
2. Additional bilateral nonobstructive renal calculi as above.
3. 3 mm left lower lobe pulmonary nodule. If the patient has no
underlying risk factors (i.e. smoking), no further followup is
needed. If there are underlying risk factors, a followup CT in 12
months could be considered to evaluate for interval change as
clinically desired.
